# Patient Record
Sex: Male | Born: 1990 | Race: White | Hispanic: No | Marital: Married | Smoking: Never smoker
Health system: Southern US, Community
[De-identification: ages and names within clinical notes are randomized; demographics above are authoritative.]

## PROBLEM LIST (undated history)

## (undated) DIAGNOSIS — M1990 Unspecified osteoarthritis, unspecified site: Secondary | ICD-10-CM

## (undated) DIAGNOSIS — E291 Testicular hypofunction: Secondary | ICD-10-CM

## (undated) DIAGNOSIS — E781 Pure hyperglyceridemia: Secondary | ICD-10-CM

## (undated) DIAGNOSIS — E119 Type 2 diabetes mellitus without complications: Secondary | ICD-10-CM

## (undated) HISTORY — DX: Unspecified osteoarthritis, unspecified site: M19.90

## (undated) HISTORY — DX: Testicular hypofunction: E29.1

## (undated) HISTORY — DX: Pure hyperglyceridemia: E78.1

## (undated) HISTORY — PX: SPINE SURGERY: SHX786

## (undated) HISTORY — PX: APPENDECTOMY: SHX54

---

## 2014-04-29 ENCOUNTER — Ambulatory Visit (INDEPENDENT_AMBULATORY_CARE_PROVIDER_SITE_OTHER): Payer: BC Managed Care – PPO | Admitting: Emergency Medicine

## 2014-04-29 VITALS — BP 125/80 | HR 86 | Temp 98.5°F | Resp 16 | Ht 71.0 in | Wt 263.0 lb

## 2014-04-29 DIAGNOSIS — J209 Acute bronchitis, unspecified: Secondary | ICD-10-CM

## 2014-04-29 DIAGNOSIS — J018 Other acute sinusitis: Secondary | ICD-10-CM

## 2014-04-29 MED ORDER — PSEUDOEPHEDRINE-GUAIFENESIN ER 60-600 MG PO TB12: 1.0000 | ORAL_TABLET | Freq: Two times a day (BID) | ORAL | Status: DC

## 2014-04-29 MED ORDER — LEVOFLOXACIN 500 MG PO TABS: 500.0000 mg | ORAL_TABLET | Freq: Every day | ORAL | Status: AC

## 2014-04-29 MED ORDER — PROMETHAZINE-CODEINE 6.25-10 MG/5ML PO SYRP: 5.0000 mL | ORAL_SOLUTION | Freq: Four times a day (QID) | ORAL | Status: DC | PRN

## 2014-04-29 NOTE — Patient Instructions (Signed)

## 2014-04-29 NOTE — Progress Notes (Signed)
[facility] [address] [place] [address] [phone]  Date:  04/29/2014   Name:  Luke Wise   DOB:  Jun 21, 1991   MRN:  [id number]  PCP:  No PCP Per Patient    Chief Complaint: Cough and Nasal Congestion   History of Present Illness:  Luke Wise is a 23 y.o. very pleasant male patient who presents with the following:  Ill since a week ago with nasal congestion and a purulent nasal drainage and post nasal drip.  Has congestion and fullness in both ears. No fever or chills. Cough is occasionally productive and worse at night.  No wheezing or shortness of breath.   No improvement with over the counter medications or other home remedies. Denies other complaint or health concern today.    There are no active problems to display for this patient.   Past Medical History  Diagnosis Date  . Arthritis     Past Surgical History  Procedure Laterality Date  . Appendectomy    . Spine surgery      History  Substance Use Topics  . Smoking status: Never Smoker   . Smokeless tobacco: Not on file  . Alcohol Use: Not on file    Family History  Problem Relation Age of Onset  . Hypertension Mother     Allergies  Allergen Reactions  . Amoxicillin     RASH   . Penicillins     RASH    Medication list has been reviewed and updated.  No current outpatient prescriptions on file prior to visit.   No current facility-administered medications on file prior to visit.    Review of Systems:  As per HPI, otherwise negative.    Physical Examination: Filed Vitals:   04/29/14 1122  BP: 125/80  Pulse: 86  Temp: 98.5 F (36.9 C)  Resp: 16   Filed Vitals:   04/29/14 1122  Height: 5\' 11"  (1.803 m)  Weight: 263 lb (119.296 kg)   Body mass index is 36.7 kg/(m^2). Ideal Body Weight: Weight in (lb) to have BMI = 25: 178.9  GEN: WDWN, NAD, Non-toxic, A & O x 3 HEENT: Atraumatic, Normocephalic. Neck supple. No masses, No LAD. Ears and Nose: No  external deformity. CV: RRR, No M/G/R. No JVD. No thrill. No extra heart sounds. PULM: CTA B, no wheezes, crackles, rhonchi. No retractions. No resp. distress. No accessory muscle use. ABD: S, NT, ND, +BS. No rebound. No HSM. EXTR: No c/c/e NEURO Normal gait.  PSYCH: Normally interactive. Conversant. Not depressed or anxious appearing.  Calm demeanor.    Assessment and Plan: Sinusitis Bronchitis levaquin mucinex d Phen c cod   Signed,  Phillips Odor, MD

## 2015-01-13 ENCOUNTER — Ambulatory Visit (INDEPENDENT_AMBULATORY_CARE_PROVIDER_SITE_OTHER): Payer: BLUE CROSS/BLUE SHIELD | Admitting: Internal Medicine

## 2015-01-13 VITALS — BP 114/68 | HR 118 | Temp 98.2°F | Resp 17 | Ht 70.5 in | Wt 258.0 lb

## 2015-01-13 DIAGNOSIS — J029 Acute pharyngitis, unspecified: Secondary | ICD-10-CM

## 2015-01-13 DIAGNOSIS — R197 Diarrhea, unspecified: Secondary | ICD-10-CM

## 2015-01-13 DIAGNOSIS — J039 Acute tonsillitis, unspecified: Secondary | ICD-10-CM

## 2015-01-13 DIAGNOSIS — R059 Cough, unspecified: Secondary | ICD-10-CM

## 2015-01-13 DIAGNOSIS — R05 Cough: Secondary | ICD-10-CM

## 2015-01-13 LAB — POCT RAPID STREP A (OFFICE): Rapid Strep A Screen: NEGATIVE

## 2015-01-13 MED ORDER — AZITHROMYCIN 500 MG PO TABS: 500.0000 mg | ORAL_TABLET | Freq: Every day | ORAL | Status: DC

## 2015-01-13 MED ORDER — HYDROCOD POLST-CHLORPHEN POLST 10-8 MG/5ML PO LQCR: 5.0000 mL | Freq: Two times a day (BID) | ORAL | Status: DC | PRN

## 2015-01-13 NOTE — Progress Notes (Signed)
   Subjective:    Patient ID: Luke Wise, male    DOB: 1991/06/21, 24 y.o.   MRN: [id number]  HPI 2 days of sore throat, cough, and then this am 4 loose stools. Had high fever 2 days ago, now resolved, also has nasal congestion. No sob or cp.   Review of Systems     Objective:   Physical Exam  Constitutional: He is oriented to person, place, and time. He appears well-developed and well-nourished. No distress.  HENT:  Head: Normocephalic.  Right Ear: External ear normal.  Left Ear: External ear normal.  Nose: Mucosal edema, rhinorrhea and sinus tenderness present. Right sinus exhibits maxillary sinus tenderness. Right sinus exhibits no frontal sinus tenderness. Left sinus exhibits maxillary sinus tenderness. Left sinus exhibits no frontal sinus tenderness.  Mouth/Throat: Oropharyngeal exudate present.  Eyes: EOM are normal.  Neck: Normal range of motion.  Cardiovascular: Normal rate.   Pulmonary/Chest: Effort normal and breath sounds normal.  Lymphadenopathy:    He has cervical adenopathy.  Neurological: He is alert and oriented to person, place, and time. He exhibits normal muscle tone. Coordination normal.  Psychiatric: He has a normal mood and affect.     Results for orders placed or performed in visit on 01/13/15  POCT rapid strep A  Result Value Ref Range   Rapid Strep A Screen Negative Negative        Assessment & Plan:  Cough/Tonsillitis/Loose stools Tussionex/zithromax 500mg /Mucinex

## 2015-01-13 NOTE — Patient Instructions (Signed)
Food Choices to Help Relieve Diarrhea When you have diarrhea, the foods you eat and your eating habits are very important. Choosing the right foods and drinks can help relieve diarrhea. Also, because diarrhea can last up to 7 days, you need to replace lost fluids and electrolytes (such as sodium, potassium, and chloride) in order to help prevent dehydration.  WHAT GENERAL GUIDELINES DO I NEED TO FOLLOW?  Slowly drink 1 cup (8 oz) of fluid for each episode of diarrhea. If you are getting enough fluid, your urine will be clear or pale yellow.  Eat starchy foods. Some good choices include white rice, white toast, pasta, low-fiber cereal, baked potatoes (without the skin), saltine crackers, and bagels.  Avoid large servings of any cooked vegetables.  Limit fruit to two servings per day. A serving is  cup or 1 small piece.  Choose foods with less than 2 g of fiber per serving.  Limit fats to less than 8 tsp (38 g) per day.  Avoid fried foods.  Eat foods that have probiotics in them. Probiotics can be found in certain dairy products.  Avoid foods and beverages that may increase the speed at which food moves through the stomach and intestines (gastrointestinal tract). Things to avoid include:  High-fiber foods, such as dried fruit, raw fruits and vegetables, nuts, seeds, and whole grain foods.  Spicy foods and high-fat foods.  Foods and beverages sweetened with high-fructose corn syrup, honey, or sugar alcohols such as xylitol, sorbitol, and mannitol. WHAT FOODS ARE RECOMMENDED? Grains White rice. White, French, or pita breads (fresh or toasted), including plain rolls, buns, or bagels. White pasta. Saltine, soda, or graham crackers. Pretzels. Low-fiber cereal. Cooked cereals made with water (such as cornmeal, farina, or cream cereals). Plain muffins. Matzo. Melba toast. Zwieback.  Vegetables Potatoes (without the skin). Strained tomato and vegetable juices. Most well-cooked and canned  vegetables without seeds. Tender lettuce. Fruits Cooked or canned applesauce, apricots, cherries, fruit cocktail, grapefruit, peaches, pears, or plums. Fresh bananas, apples without skin, cherries, grapes, cantaloupe, grapefruit, peaches, oranges, or plums.  Meat and Other Protein Products Baked or boiled chicken. Eggs. Tofu. Fish. Seafood. Smooth peanut butter. Ground or well-cooked tender beef, ham, veal, lamb, pork, or poultry.  Dairy Plain yogurt, kefir, and unsweetened liquid yogurt. Lactose-free milk, buttermilk, or soy milk. Plain hard cheese. Beverages Sport drinks. Clear broths. Diluted fruit juices (except prune). Regular, caffeine-free sodas such as ginger ale. Water. Decaffeinated teas. Oral rehydration solutions. Sugar-free beverages not sweetened with sugar alcohols. Other Bouillon, broth, or soups made from recommended foods.  The items listed above may not be a complete list of recommended foods or beverages. Contact your dietitian for more options. WHAT FOODS ARE NOT RECOMMENDED? Grains Whole grain, whole wheat, bran, or rye breads, rolls, pastas, crackers, and cereals. Wild or brown rice. Cereals that contain more than 2 g of fiber per serving. Corn tortillas or taco shells. Cooked or dry oatmeal. Granola. Popcorn. Vegetables Raw vegetables. Cabbage, broccoli, Brussels sprouts, artichokes, baked beans, beet greens, corn, kale, legumes, peas, sweet potatoes, and yams. Potato skins. Cooked spinach and cabbage. Fruits Dried fruit, including raisins and dates. Raw fruits. Stewed or dried prunes. Fresh apples with skin, apricots, mangoes, pears, raspberries, and strawberries.  Meat and Other Protein Products Chunky peanut butter. Nuts and seeds. Beans and lentils. Bacon.  Dairy High-fat cheeses. Milk, chocolate milk, and beverages made with milk, such as milk shakes. Cream. Ice cream. Sweets and Desserts Sweet rolls, doughnuts, and sweet breads. Pancakes   and waffles. Fats and  Oils Butter. Cream sauces. Margarine. Salad oils. Plain salad dressings. Olives. Avocados.  Beverages Caffeinated beverages (such as coffee, tea, soda, or energy drinks). Alcoholic beverages. Fruit juices with pulp. Prune juice. Soft drinks sweetened with high-fructose corn syrup or sugar alcohols. Other Coconut. Hot sauce. Chili powder. Mayonnaise. Gravy. Cream-based or milk-based soups.  The items listed above may not be a complete list of foods and beverages to avoid. Contact your dietitian for more information. WHAT SHOULD I DO IF I BECOME DEHYDRATED? Diarrhea can sometimes lead to dehydration. Signs of dehydration include dark urine and dry mouth and skin. If you think you are dehydrated, you should rehydrate with an oral rehydration solution. These solutions can be purchased at pharmacies, retail stores, or online.  Drink -1 cup (120-240 mL) of oral rehydration solution each time you have an episode of diarrhea. If drinking this amount makes your diarrhea worse, try drinking smaller amounts more often. For example, drink 1-3 tsp (5-15 mL) every 5-10 minutes.  A general rule for staying hydrated is to drink 1-2 L of fluid per day. Talk to your health care provider about the specific amount you should be drinking each day. Drink enough fluids to keep your urine clear or pale yellow. Document Released: 02/05/2004 Document Revised: 11/20/2013 Document Reviewed: 10/08/2013 [facility] Patient Information 2015 [place], [place]. This information is not intended to replace advice given to you by your health care provider. Make sure you discuss any questions you have with your health care provider. Sinusitis Sinusitis is redness, soreness, and inflammation of the paranasal sinuses. Paranasal sinuses are air pockets within the bones of your face (beneath the eyes, the middle of the forehead, or above the eyes). In healthy paranasal sinuses, mucus is able to drain out, and air is able to circulate through  them by way of your nose. However, when your paranasal sinuses are inflamed, mucus and air can become trapped. This can allow bacteria and other germs to grow and cause infection. Sinusitis can develop quickly and last only a short time (acute) or continue over a long period (chronic). Sinusitis that lasts for more than 12 weeks is considered chronic.  CAUSES  Causes of sinusitis include:  Allergies.  Structural abnormalities, such as displacement of the cartilage that separates your nostrils (deviated septum), which can decrease the air flow through your nose and sinuses and affect sinus drainage.  Functional abnormalities, such as when the small hairs (cilia) that line your sinuses and help remove mucus do not work properly or are not present. SIGNS AND SYMPTOMS  Symptoms of acute and chronic sinusitis are the same. The primary symptoms are pain and pressure around the affected sinuses. Other symptoms include:  Upper toothache.  Earache.  Headache.  Bad breath.  Decreased sense of smell and taste.  A cough, which worsens when you are lying flat.  Fatigue.  Fever.  Thick drainage from your nose, which often is green and may contain pus (purulent).  Swelling and warmth over the affected sinuses. DIAGNOSIS  Your health care provider will perform a physical exam. During the exam, your health care provider may:  Look in your nose for signs of abnormal growths in your nostrils (nasal polyps).  Tap over the affected sinus to check for signs of infection.  View the inside of your sinuses (endoscopy) using an imaging device that has a light attached (endoscope). If your health care provider suspects that you have chronic sinusitis, one or more of the following  tests may be recommended:  Allergy tests.  Nasal culture. A sample of mucus is taken from your nose, sent to a lab, and screened for bacteria.  Nasal cytology. A sample of mucus is taken from your nose and examined by  your health care provider to determine if your sinusitis is related to an allergy. TREATMENT  Most cases of acute sinusitis are related to a viral infection and will resolve on their own within 10 days. Sometimes medicines are prescribed to help relieve symptoms (pain medicine, decongestants, nasal steroid sprays, or saline sprays).  However, for sinusitis related to a bacterial infection, your health care provider will prescribe antibiotic medicines. These are medicines that will help kill the bacteria causing the infection.  Rarely, sinusitis is caused by a fungal infection. In theses cases, your health care provider will prescribe antifungal medicine. For some cases of chronic sinusitis, surgery is needed. Generally, these are cases in which sinusitis recurs more than 3 times per year, despite other treatments. HOME CARE INSTRUCTIONS   Drink plenty of water. Water helps thin the mucus so your sinuses can drain more easily.  Use a humidifier.  Inhale steam 3 to 4 times a day (for example, sit in the bathroom with the shower running).  Apply a warm, moist washcloth to your face 3 to 4 times a day, or as directed by your health care provider.  Use saline nasal sprays to help moisten and clean your sinuses.  Take medicines only as directed by your health care provider.  If you were prescribed either an antibiotic or antifungal medicine, finish it all even if you start to feel better. SEEK IMMEDIATE MEDICAL CARE IF:  You have increasing pain or severe headaches.  You have nausea, vomiting, or drowsiness.  You have swelling around your face.  You have vision problems.  You have a stiff neck.  You have difficulty breathing. MAKE SURE YOU:   Understand these instructions.  Will watch your condition.  Will get help right away if you are not doing well or get worse. Document Released: 11/15/2005 Document Revised: 04/01/2014 Document Reviewed: 11/30/2011 [facility] Patient  Information 2015 [place], [place]. This information is not intended to replace advice given to you by your health care provider. Make sure you discuss any questions you have with your health care provider.

## 2015-11-26 ENCOUNTER — Ambulatory Visit (INDEPENDENT_AMBULATORY_CARE_PROVIDER_SITE_OTHER): Payer: BLUE CROSS/BLUE SHIELD | Admitting: Physician Assistant

## 2015-11-26 VITALS — BP 118/82 | HR 96 | Temp 98.1°F | Resp 18 | Ht 70.5 in | Wt 261.0 lb

## 2015-11-26 DIAGNOSIS — R05 Cough: Secondary | ICD-10-CM | POA: Diagnosis not present

## 2015-11-26 DIAGNOSIS — J039 Acute tonsillitis, unspecified: Secondary | ICD-10-CM

## 2015-11-26 DIAGNOSIS — J069 Acute upper respiratory infection, unspecified: Secondary | ICD-10-CM

## 2015-11-26 DIAGNOSIS — J029 Acute pharyngitis, unspecified: Secondary | ICD-10-CM | POA: Diagnosis not present

## 2015-11-26 DIAGNOSIS — R059 Cough, unspecified: Secondary | ICD-10-CM

## 2015-11-26 DIAGNOSIS — B9789 Other viral agents as the cause of diseases classified elsewhere: Principal | ICD-10-CM

## 2015-11-26 MED ORDER — HYDROCOD POLST-CPM POLST ER 10-8 MG/5ML PO SUER
5.0000 mL | Freq: Every evening | ORAL | Status: DC | PRN
Start: 2015-11-26 — End: 2016-04-09

## 2015-11-26 MED ORDER — BENZONATATE 100 MG PO CAPS: 100.0000 mg | ORAL_CAPSULE | Freq: Three times a day (TID) | ORAL | Status: DC | PRN

## 2015-11-26 MED ORDER — GUAIFENESIN ER 1200 MG PO TB12: 1.0000 | ORAL_TABLET | Freq: Two times a day (BID) | ORAL | Status: DC | PRN

## 2015-11-26 NOTE — Patient Instructions (Signed)
Upper Respiratory Infection, Adult Most upper respiratory infections (URIs) are a viral infection of the air passages leading to the lungs. A URI affects the nose, throat, and upper air passages. The most common type of URI is nasopharyngitis and is typically referred to as "the common cold." URIs run their course and usually go away on their own. Most of the time, a URI does not require medical attention, but sometimes a bacterial infection in the upper airways can follow a viral infection. This is called a secondary infection. Sinus and middle ear infections are common types of secondary upper respiratory infections. Bacterial pneumonia can also complicate a URI. A URI can worsen asthma and chronic obstructive pulmonary disease (COPD). Sometimes, these complications can require emergency medical care and may be life threatening.  CAUSES Almost all URIs are caused by viruses. A virus is a type of germ and can spread from one person to another.  RISKS FACTORS You may be at risk for a URI if:   You smoke.   You have chronic heart or lung disease.  You have a weakened defense (immune) system.   You are very young or very old.   You have nasal allergies or asthma.  You work in crowded or poorly ventilated areas.  You work in health care facilities or schools. SIGNS AND SYMPTOMS  Symptoms typically develop 2-3 days after you come in contact with a cold virus. Most viral URIs last 7-10 days. However, viral URIs from the influenza virus (flu virus) can last 14-18 days and are typically more severe. Symptoms may include:   Runny or stuffy (congested) nose.   Sneezing.   Cough.   Sore throat.   Headache.   Fatigue.   Fever.   Loss of appetite.   Pain in your forehead, behind your eyes, and over your cheekbones (sinus pain).  Muscle aches.  DIAGNOSIS  Your health care provider may diagnose a URI by:  Physical exam.  Tests to check that your symptoms are not due to  another condition such as:  Strep throat.  Sinusitis.  Pneumonia.  Asthma. TREATMENT  A URI goes away on its own with time. It cannot be cured with medicines, but medicines may be prescribed or recommended to relieve symptoms. Medicines may help:  Reduce your fever.  Reduce your cough.  Relieve nasal congestion. HOME CARE INSTRUCTIONS   Take medicines only as directed by your health care provider.   Gargle warm saltwater or take cough drops to comfort your throat as directed by your health care provider.  Use a warm mist humidifier or inhale steam from a shower to increase air moisture. This may make it easier to breathe.  Drink enough fluid to keep your urine clear or pale yellow.   Eat soups and other clear broths and maintain good nutrition.   Rest as needed.   Return to work when your temperature has returned to normal or as your health care provider advises. You may need to stay home longer to avoid infecting others. You can also use a face mask and careful hand washing to prevent spread of the virus.  Increase the usage of your inhaler if you have asthma.   Do not use any tobacco products, including cigarettes, chewing tobacco, or electronic cigarettes. If you need help quitting, ask your health care provider. PREVENTION  The best way to protect yourself from getting a cold is to practice good hygiene.   Avoid oral or hand contact with people with cold   symptoms.   Wash your hands often if contact occurs.  There is no clear evidence that vitamin C, vitamin E, echinacea, or exercise reduces the chance of developing a cold. However, it is always recommended to get plenty of rest, exercise, and practice good nutrition.  SEEK MEDICAL CARE IF:   You are getting worse rather than better.   Your symptoms are not controlled by medicine.   You have chills.  You have worsening shortness of breath.  You have brown or red mucus.  You have yellow or brown nasal  discharge.  You have pain in your face, especially when you bend forward.  You have a fever.  You have swollen neck glands.  You have pain while swallowing.  You have white areas in the back of your throat. SEEK IMMEDIATE MEDICAL CARE IF:   You have severe or persistent:  Headache.  Ear pain.  Sinus pain.  Chest pain.  You have chronic lung disease and any of the following:  Wheezing.  Prolonged cough.  Coughing up blood.  A change in your usual mucus.  You have a stiff neck.  You have changes in your:  Vision.  Hearing.  Thinking.  Mood. MAKE SURE YOU:   Understand these instructions.  Will watch your condition.  Will get help right away if you are not doing well or get worse.   This information is not intended to replace advice given to you by your health care provider. Make sure you discuss any questions you have with your health care provider.   Document Released: 05/11/2001 Document Revised: 04/01/2015 Document Reviewed: 02/20/2014 Elsevier Interactive Patient Education 2016 [employer]vier Inc.  

## 2015-11-26 NOTE — Progress Notes (Signed)
[facility] [address], [place] [id number] [phone]  Date:  11/26/2015   Name:  Luke Wise   DOB:  February 21, 1991   MRN:  [id number]  PCP:  No PCP Per Patient    History of Present Illness:  Luke Wise is a 24 y.o. male patient [facility] complaint of sore throat and productive cough.  Patient states this started 3 days with productive clear sputum.  He has nasal congestion with clear mucus.  He denies fever at this time, though he had some subjective fever and chills.  No sob or dyspnea. No GI symptoms.   He has taken flu congestion, sore throat, and nasal congestion medication which appears to not be working much.   There are no active problems to display for this patient.   Past Medical History  Diagnosis Date  . Arthritis     Past Surgical History  Procedure Laterality Date  . Appendectomy    . Spine surgery      Social History  Substance Use Topics  . Smoking status: Never Smoker   . Smokeless tobacco: None  . Alcohol Use: None    Family History  Problem Relation Age of Onset  . Hypertension Mother     Allergies  Allergen Reactions  . Amoxicillin     RASH   . Penicillins     RASH    Medication list has been reviewed and updated.  Current Outpatient Prescriptions on File Prior to Visit  Medication Sig Dispense Refill  . azithromycin (ZITHROMAX) 500 MG tablet Take 1 tablet (500 mg total) by mouth daily. (Patient not taking: Reported on 11/26/2015) 5 tablet 0  . chlorpheniramine-HYDROcodone (TUSSIONEX PENNKINETIC ER) 10-8 MG/5ML LQCR Take 5 mLs by mouth every 12 (twelve) hours as needed for cough (cough). (Patient not taking: Reported on 11/26/2015) 115 mL 0  . fenofibrate (TRICOR) 145 MG tablet Take 145 mg by mouth daily. Reported on 11/26/2015     No current facility-administered medications on file prior to visit.    ROS   Physical Examination: BP 118/82 mmHg  Pulse 96  Temp(Src) 98.1 F  (36.7 C) (Oral)  Resp 18  Ht 5' 10.5" (1.791 m)  Wt 261 lb (118.389 kg)  BMI 36.91 kg/m2  SpO2 98% Ideal Body Weight: Weight in (lb) to have BMI = 25: 176.4  Physical Exam  Constitutional: He is oriented to person, place, and time. He appears well-developed and well-nourished. No distress.  HENT:  Head: Atraumatic.  Right Ear: Tympanic membrane, external ear and ear canal normal.  Left Ear: Tympanic membrane, external ear and ear canal normal.  Nose: Mucosal edema and rhinorrhea present. Right sinus exhibits no maxillary sinus tenderness and no frontal sinus tenderness. Left sinus exhibits no maxillary sinus tenderness and no frontal sinus tenderness.  Mouth/Throat: No uvula swelling. No oropharyngeal exudate, posterior oropharyngeal edema or posterior oropharyngeal erythema.  Eyes: Conjunctivae, EOM and lids are normal. Pupils are equal, round, and reactive to light. Right eye exhibits normal extraocular motion. Left eye exhibits normal extraocular motion.  Neck: Trachea normal and full passive range of motion without pain. No edema and no erythema present.  Cardiovascular: Normal rate.   Pulmonary/Chest: Effort normal. No respiratory distress. He has no decreased breath sounds. He has no wheezes. He has no rhonchi.  Neurological: He is alert and oriented to person, place, and time.  Skin: Skin is warm and dry. He is not diaphoretic.  Psychiatric: He  has a normal mood and affect. His behavior is normal.     Assessment and Plan: Luke Wise is a 24 y.o. male who is here today for sore throat, congestion, and cough. This appears to be viral.  Treating supportively. Advised to contact me if he continues to have difficulty after 3 days.    Viral URI with cough  Sore throat - Plan: chlorpheniramine-HYDROcodone (TUSSIONEX PENNKINETIC ER) 10-8 MG/5ML SUER  Cough - Plan: chlorpheniramine-HYDROcodone (TUSSIONEX PENNKINETIC ER) 10-8 MG/5ML SUER, Guaifenesin (MUCINEX MAXIMUM STRENGTH)  1200 MG TB12, benzonatate (TESSALON) 100 MG capsule  Tonsillitis  Trena Platt, PA-C [facility] 11/26/2015 11:29 AM

## 2016-04-09 ENCOUNTER — Ambulatory Visit (INDEPENDENT_AMBULATORY_CARE_PROVIDER_SITE_OTHER): Payer: BLUE CROSS/BLUE SHIELD | Admitting: Physician Assistant

## 2016-04-09 VITALS — BP 126/80 | HR 70 | Temp 97.8°F | Resp 16 | Ht 70.5 in | Wt 256.6 lb

## 2016-04-09 DIAGNOSIS — M545 Low back pain, unspecified: Secondary | ICD-10-CM

## 2016-04-09 MED ORDER — MELOXICAM 15 MG PO TABS: 15.0000 mg | ORAL_TABLET | Freq: Every day | ORAL | Status: DC

## 2016-04-09 MED ORDER — CYCLOBENZAPRINE HCL 10 MG PO TABS: 5.0000 mg | ORAL_TABLET | Freq: Three times a day (TID) | ORAL | Status: DC | PRN

## 2016-04-09 NOTE — Progress Notes (Signed)
   04/09/2016 11:15 AM   DOB: 1991-11-15 / MRN: [id number]  SUBJECTIVE:  Luke Wise is a 25 y.o. male presenting for low back pain that started ten days ago and is worsening.  He denies weakness, numbness, incontinence. Denies any trauma to the area.  Has tried 7.5 mg Meloxicam without relief.    He is allergic to amoxicillin and penicillins.   He  has a past medical history of Arthritis.    He  reports that he has never smoked. He does not have any smokeless tobacco history on file. He  has no sexual activity history on file. The patient  has past surgical history that includes Appendectomy and Spine surgery.  His family history includes Hypertension in his mother.  Review of Systems  Constitutional: Negative for fever and chills.  Respiratory: Negative for shortness of breath.   Gastrointestinal: Negative for nausea, vomiting and abdominal pain.  Genitourinary: Negative for dysuria, urgency and frequency.  Musculoskeletal: Positive for myalgias and back pain.  Skin: Negative for rash.  Neurological: Negative for dizziness, tingling, focal weakness and headaches.  Psychiatric/Behavioral: The patient does not have insomnia.     Problem list and medications reviewed and updated by myself where necessary, and exist elsewhere in the encounter.   OBJECTIVE:  BP 126/80 mmHg  Pulse 70  Temp(Src) 97.8 F (36.6 C) (Oral)  Resp 16  Ht 5' 10.5" (1.791 m)  Wt 256 lb 9.6 oz (116.393 kg)  BMI 36.29 kg/m2  SpO2 97%  Physical Exam  Constitutional: He is oriented to person, place, and time. He appears well-developed. He does not appear ill.  Eyes: Conjunctivae and EOM are normal. Pupils are equal, round, and reactive to light.  Cardiovascular: Normal rate.   Pulmonary/Chest: Effort normal.  Abdominal: He exhibits no distension.  Musculoskeletal: Normal range of motion.  Neurological: He is alert and oriented to person, place, and time. He has normal strength and normal reflexes. No  cranial nerve deficit or sensory deficit. He displays a negative Romberg sign. Gait abnormal. Coordination normal.  Skin: Skin is warm and dry. He is not diaphoretic.  Psychiatric: He has a normal mood and affect.  Nursing note and vitals reviewed.   No results found for this or any previous visit (from the past 72 hour(s)).  No results found.  ASSESSMENT AND PLAN  Luke was seen today for back pain.  Diagnoses and all orders for this visit:  Midline low back pain without sciatica  Other orders -     meloxicam (MOBIC) 15 MG tablet; Take 1 tablet (15 mg total) by mouth daily. -     cyclobenzaprine (FLEXERIL) 10 MG tablet; Take 0.5-1 tablets (5-10 mg total) by mouth 3 (three) times daily as needed. May cause drowsiness.   The patient was advised to call or return to clinic if he does not see an improvement in symptoms or to seek the care of the closest emergency department if he worsens with the above plan.   Deliah BostonMichael Roger Kettles, MHS, PA-C [facility] 04/09/2016 11:15 AM

## 2016-04-09 NOTE — Patient Instructions (Addendum)
Please let me know if you are not better in 7 days and I will try prednisone.  Call [phone] and ask to leave a message for Deliah BostonMichael Clark PA-C    IF you received an x-ray today, you will receive an invoice from [facility]. Please contact [facility] at [phone] with questions or concerns regarding your invoice.   IF you received labwork today, you will receive an invoice from [facility]. Please contact [facility] at [phone] with questions or concerns regarding your invoice.   Our billing staff will not be able to assist you with questions regarding bills from these companies.  You will be contacted with the lab results as soon as they are available. The fastest way to get your results is to activate your My Chart account. Instructions are located on the last page of this paperwork. If you have not heard from us regarding the results in 2 weeks, please contact this office.

## 2016-11-05 ENCOUNTER — Ambulatory Visit: Payer: Self-pay | Admitting: Endocrinology

## 2016-11-11 ENCOUNTER — Ambulatory Visit (INDEPENDENT_AMBULATORY_CARE_PROVIDER_SITE_OTHER): Payer: BLUE CROSS/BLUE SHIELD | Admitting: Endocrinology

## 2016-11-11 ENCOUNTER — Encounter: Payer: Self-pay | Admitting: Endocrinology

## 2016-11-11 DIAGNOSIS — E781 Pure hyperglyceridemia: Secondary | ICD-10-CM | POA: Diagnosis not present

## 2016-11-11 DIAGNOSIS — E291 Testicular hypofunction: Secondary | ICD-10-CM | POA: Diagnosis not present

## 2016-11-11 LAB — TSH: TSH: 1.35 u[IU]/mL (ref 0.35–4.50)

## 2016-11-11 LAB — LUTEINIZING HORMONE: LH: 6.01 m[IU]/mL (ref 1.50–9.30)

## 2016-11-11 NOTE — Progress Notes (Signed)
Subjective:    Patient ID: Luke Wise, male    DOB: Nov 20, 1991, 25 y.o.   MRN: [id number]  HPI Pt is referred by Dr Via, for hypogonadism.  Pt reports he had puberty at age 25.  He has no biological children.  He says he has never taken illicit androgens.  He has never been on any prescribed medication for hypogonadism.  He does not take antiandrogens or opioids.  He denies any h/o infertility, XRT, or genital infection.  He has never had surgery, or a serious injury to the head or genital area. He has no h/o sleep apnea or DVT.   He does not consume alcohol excessively.  He has moderate assoc ED sxs.   Past Medical History:  Diagnosis Date  . Arthritis   . Hypertriglyceridemia   . Hypogonadism male     Past Surgical History:  Procedure Laterality Date  . APPENDECTOMY    . SPINE SURGERY      Social History   Social History  . Marital status: Single    Spouse name: N/A  . Number of children: N/A  . Years of education: N/A   Occupational History  . Not on file.   Social History Main Topics  . Smoking status: Never Smoker  . Smokeless tobacco: Not on file  . Alcohol use Not on file  . Drug use: Unknown  . Sexual activity: Not on file   Other Topics Concern  . Not on file   Social History Narrative  . No narrative on file    No current outpatient prescriptions on file prior to visit.   No current facility-administered medications on file prior to visit.     Allergies  Allergen Reactions  . Amoxicillin     RASH   . Penicillins     RASH    Family History  Problem Relation Age of Onset  . Hypertension Mother   . Other Neg Hx     hypogonadism    BP 116/80   Pulse (!) 104   Ht 5' 11.5" (1.816 m)   Wt 267 lb (121.1 kg)   SpO2 96%   BMI 36.72 kg/m   Review of Systems denies depression, numbness, weight change, decreased urinary stream, gynecomastia, muscle weakness, fever, headache, easy bruising, sob, rash, blurry vision, rhinorrhea, chest pain.        Objective:   Physical Exam VS: see vs page GEN: no distress HEAD: head: no deformity eyes: no periorbital swelling, no proptosis.  external nose and ears are normal.  mouth: no lesion seen.  NECK: supple, thyroid is not enlarged.  CHEST WALL: no deformity.  LUNGS: clear to auscultation.  BREASTS:  Mild bilat pseudogynecomastia.   CV: reg rate and rhythm, no murmur.  ABD: abdomen is soft, nontender.  no hepatosplenomegaly.  not distended.  no hernia GENITALIA:  Normal male.   MUSCULOSKELETAL: muscle bulk and strength are grossly normal.  no obvious joint swelling.  gait is normal and steady EXTEMITIES: no deformity.  no ulcer on the feet.  feet are of normal color and temp.  no edema PULSES: dorsalis pedis intact bilat.  no carotid bruit NEURO:  cn 2-12 grossly intact.   readily moves all 4's.  sensation is intact to touch on the feet SKIN:  Normal texture and temperature.  No rash or suspicious lesion is visible.  Normal hair distribution.  NODES:  None palpable at the neck PSYCH: alert, well-oriented.  Does not appear anxious nor depressed.  outside  test results are reviewed: Testosterone=  160 and 190 AST=78  I have reviewed outside records, and summarized: Pt was noted to have low testosterone, and referred here.  He reported ED sxs approx 1/2 of the time. He is also being rx'ed for hypertriglyceridemia Lab Results  Component Value Date   TESTOSTERONE 141 (L) 11/11/2016   Lab Results  Component Value Date   TSH 1.35 11/11/2016      Assessment & Plan:  central hypogonadism, new, uncertain etiology.  Check MRI.  NASH: this could worse with testosterone rx. ED: prob due to hypogonadism.  Obesity: this worsens hypogonadism.    Patient is advised the following: Patient Instructions  blood tests are requested for you today.  We'll let you know about the results.  Based on the results, I hope to be able to prescribe for you a pill to increase the  testosterone. Testosterone treatment has risks, including increased or decreased fertility (depending on the type of treatment), hair loss, prostate cancer, benign prostate enlargement, blood clots, liver problems, lower hdl ("good cholesterol"), polycythemia (opposite of anemia), sleep apnea, and behavior changes. Weight loss helps the testosterone, also.

## 2016-11-11 NOTE — Patient Instructions (Addendum)
blood tests are requested for you today.  We'll let you know about the results.  Based on the results, I hope to be able to prescribe for you a pill to increase the testosterone. Testosterone treatment has risks, including increased or decreased fertility (depending on the type of treatment), hair loss, prostate cancer, benign prostate enlargement, blood clots, liver problems, lower hdl ("good cholesterol"), polycythemia (opposite of anemia), sleep apnea, and behavior changes. Weight loss helps the testosterone, also.

## 2016-11-12 LAB — PROLACTIN: Prolactin: 6.8 ng/mL (ref 2.0–18.0)

## 2016-11-13 LAB — TESTOSTERONE,FREE AND TOTAL
TESTOSTERONE FREE: 8.3 pg/mL — AB (ref 9.3–26.5)
Testosterone: 141 ng/dL — ABNORMAL LOW (ref 264–916)

## 2016-12-11 ENCOUNTER — Ambulatory Visit
Admission: RE | Admit: 2016-12-11 | Discharge: 2016-12-11 | Disposition: A | Discharge status: Home / Self Care | Payer: BLUE CROSS/BLUE SHIELD | Source: Ambulatory Visit | Attending: Endocrinology | Admitting: Endocrinology

## 2016-12-11 DIAGNOSIS — E291 Testicular hypofunction: Secondary | ICD-10-CM

## 2016-12-11 MED ORDER — GADOBENATE DIMEGLUMINE 529 MG/ML IV SOLN
20.0000 mL | Freq: Once | INTRAVENOUS | Status: AC | PRN
  Administered 2016-12-11: 20 mL via INTRAVENOUS

## 2016-12-13 ENCOUNTER — Other Ambulatory Visit: Payer: Self-pay | Admitting: Endocrinology

## 2016-12-13 DIAGNOSIS — E291 Testicular hypofunction: Secondary | ICD-10-CM

## 2016-12-13 MED ORDER — CLOMIPHENE CITRATE 50 MG PO TABS
ORAL_TABLET | ORAL | 2 refills | Status: DC
Start: 2016-12-13 — End: 2017-04-19

## 2017-01-24 ENCOUNTER — Other Ambulatory Visit (INDEPENDENT_AMBULATORY_CARE_PROVIDER_SITE_OTHER): Payer: BLUE CROSS/BLUE SHIELD

## 2017-01-24 DIAGNOSIS — E291 Testicular hypofunction: Secondary | ICD-10-CM

## 2017-01-24 LAB — CBC WITH DIFFERENTIAL/PLATELET
BASOS ABS: 0.1 10*3/uL (ref 0.0–0.1)
Basophils Relative: 0.7 % (ref 0.0–3.0)
Eosinophils Absolute: 0.3 10*3/uL (ref 0.0–0.7)
Eosinophils Relative: 2.9 % (ref 0.0–5.0)
HCT: 49.3 % (ref 39.0–52.0)
HEMOGLOBIN: 16 g/dL (ref 13.0–17.0)
LYMPHS ABS: 3.1 10*3/uL (ref 0.7–4.0)
Lymphocytes Relative: 31.9 % (ref 12.0–46.0)
MCHC: 32.4 g/dL (ref 30.0–36.0)
MCV: 75.2 fl — AB (ref 78.0–100.0)
MONOS PCT: 8.6 % (ref 3.0–12.0)
Monocytes Absolute: 0.9 10*3/uL (ref 0.1–1.0)
Neutro Abs: 5.5 10*3/uL (ref 1.4–7.7)
Neutrophils Relative %: 55.9 % (ref 43.0–77.0)
Platelets: 291 10*3/uL (ref 150.0–400.0)
RBC: 6.56 Mil/uL — AB (ref 4.22–5.81)
RDW: 15.1 % (ref 11.5–15.5)
WBC: 9.9 10*3/uL (ref 4.0–10.5)

## 2017-01-25 LAB — TESTOSTERONE,FREE AND TOTAL
TESTOSTERONE FREE: 14.9 pg/mL (ref 9.3–26.5)
TESTOSTERONE: 233 ng/dL — AB (ref 264–916)

## 2017-02-24 ENCOUNTER — Ambulatory Visit (INDEPENDENT_AMBULATORY_CARE_PROVIDER_SITE_OTHER): Payer: BLUE CROSS/BLUE SHIELD

## 2017-02-24 ENCOUNTER — Encounter: Payer: Self-pay | Admitting: Emergency Medicine

## 2017-02-24 ENCOUNTER — Ambulatory Visit (INDEPENDENT_AMBULATORY_CARE_PROVIDER_SITE_OTHER): Payer: BLUE CROSS/BLUE SHIELD | Admitting: Emergency Medicine

## 2017-02-24 VITALS — BP 131/80 | HR 74 | Temp 98.4°F | Resp 18 | Ht 71.5 in | Wt 272.2 lb

## 2017-02-24 DIAGNOSIS — R0789 Other chest pain: Secondary | ICD-10-CM

## 2017-02-24 DIAGNOSIS — R0781 Pleurodynia: Secondary | ICD-10-CM

## 2017-02-24 MED ORDER — PREDNISONE 20 MG PO TABS: 40.0000 mg | ORAL_TABLET | Freq: Every day | ORAL | 0 refills | Status: AC

## 2017-02-24 MED ORDER — DICLOFENAC SODIUM 75 MG PO TBEC: 75.0000 mg | DELAYED_RELEASE_TABLET | Freq: Two times a day (BID) | ORAL | 0 refills | Status: AC

## 2017-02-24 NOTE — Patient Instructions (Addendum)
   IF you received an x-ray today, you will receive an invoice from [facility]. Please contact [facility] at [phone] with questions or concerns regarding your invoice.   IF you received labwork today, you will receive an invoice from [facility]. Please contact [facility] at [phone] with questions or concerns regarding your invoice.   Our billing staff will not be able to assist you with questions regarding bills from these companies.  You will be contacted with the lab results as soon as they are available. The fastest way to get your results is to activate your My Chart account. Instructions are located on the last page of this paperwork. If you have not heard from us regarding the results in 2 weeks, please contact this office.     Nonspecific Chest Pain Chest pain can be caused by many different conditions. There is a chance that your pain could be related to something serious, such as a heart attack or a blood clot in your lungs. Chest pain can also be caused by conditions that are not life-threatening. If you have chest pain, it is very important to follow up with your doctor. Follow these instructions at home: Medicines  If you were prescribed an antibiotic medicine, take it as told by your doctor. Do not stop taking the antibiotic even if you start to feel better.  Take over-the-counter and prescription medicines only as told by your doctor. Lifestyle  Do not use any products that contain nicotine or tobacco, such as cigarettes and e-cigarettes. If you need help quitting, ask your doctor.  Do not drink alcohol.  Make lifestyle changes as told by your doctor. These may include: ? Getting regular exercise. Ask your doctor for some activities that are safe for you. ? Eating a heart-healthy diet. A diet specialist (dietitian) can help you to learn healthy eating options. ? Staying at a healthy weight. ? Managing diabetes, if needed. ? Lowering your  stress, as with deep breathing or spending time in nature. General instructions  Avoid any activities that make you feel chest pain.  If your chest pain is because of heartburn: ? Raise (elevate) the head of your bed about 6 inches (15 cm). You can do this by putting blocks under the bed legs at the head of the bed. ? Do not sleep with extra pillows under your head. That does not help heartburn.  Keep all follow-up visits as told by your doctor. This is important. This includes any further testing if your chest pain does not go away. Contact a doctor if:  Your chest pain does not go away.  You have a rash with blisters on your chest.  You have a fever.  You have chills. Get help right away if:  Your chest pain is worse.  You have a cough that gets worse, or you cough up blood.  You have very bad (severe) pain in your belly (abdomen).  You are very weak.  You pass out (faint).  You have either of these for no clear reason: ? Sudden chest discomfort. ? Sudden discomfort in your arms, back, neck, or jaw.  You have shortness of breath at any time.  You suddenly start to sweat, or your skin gets clammy.  You feel sick to your stomach (nauseous).  You throw up (vomit).  You suddenly feel light-headed or dizzy.  Your heart starts to beat fast, or it feels like it is skipping beats. These symptoms may be an emergency. Do not wait   to see if the symptoms will go away. Get medical help right away. Call your local emergency services (911 in the [place] Do not drive yourself to the hospital. This information is not intended to replace advice given to you by your health care provider. Make sure you discuss any questions you have with your health care provider. Document Released: 05/03/2008 Document Revised: 08/09/2016 Document Reviewed: 08/09/2016 Elsevier Interactive Patient Education  2017 Elsevier Inc.  

## 2017-02-24 NOTE — Progress Notes (Signed)
Luke Wise 26 y.o.   Chief Complaint  Patient presents with  . Breathing Problem    pain in left side when breathing on and off for about 4 days     HISTORY OF PRESENT ILLNESS: This is a 26 y.o. male complaining of left sided localized pleuritic chest pain x 4-5 days.  Chest Pain   This is a new problem. The current episode started in the past 7 days. The onset quality is gradual. The problem occurs intermittently. The problem has been waxing and waning. The pain is present in the lateral region. The pain is at a severity of 3/10. The pain is mild. The quality of the pain is described as sharp and stabbing. The pain does not radiate. Associated symptoms include shortness of breath. Pertinent negatives include no abdominal pain, claudication, cough, diaphoresis, dizziness, fever, headaches, hemoptysis, irregular heartbeat, lower extremity edema, malaise/fatigue, nausea, palpitations, syncope or vomiting. The pain is aggravated by deep breathing and coughing. There are no known risk factors.  Pertinent negatives for past medical history include no aortic aneurysm, no arrhythmia, no CAD, no cancer, no COPD, no diabetes, no hypertension, no MI, no pacemaker, no PE, no spontaneous pneumothorax and no thyroid problem.     Prior to Admission medications   Medication Sig Start Date End Date Taking? Authorizing Provider  clomiPHENE (CLOMID) 50 MG tablet 1/4 tab daily 12/13/16  Yes Romero Belling, MD  fenofibrate (TRICOR) 48 MG tablet  10/18/16  Yes Historical Provider, MD    Allergies  Allergen Reactions  . Amoxicillin     RASH   . Penicillins     RASH    Patient Active Problem List   Diagnosis Date Noted  . Hypogonadism male   . Hypertriglyceridemia     Past Medical History:  Diagnosis Date  . Arthritis   . Hypertriglyceridemia   . Hypogonadism male     Past Surgical History:  Procedure Laterality Date  . APPENDECTOMY    . SPINE SURGERY      Social History   Social  History  . Marital status: Single    Spouse name: N/A  . Number of children: N/A  . Years of education: N/A   Occupational History  . Not on file.   Social History Main Topics  . Smoking status: Never Smoker  . Smokeless tobacco: Not on file  . Alcohol use Not on file  . Drug use: Unknown  . Sexual activity: Not on file   Other Topics Concern  . Not on file   Social History Narrative  . No narrative on file    Family History  Problem Relation Age of Onset  . Hypertension Mother   . Other Neg Hx     hypogonadism     Review of Systems  Constitutional: Negative for diaphoresis, fever and malaise/fatigue.  HENT: Negative.   Eyes: Negative.   Respiratory: Positive for shortness of breath. Negative for cough and hemoptysis.   Cardiovascular: Positive for chest pain. Negative for palpitations, claudication, leg swelling and syncope.  Gastrointestinal: Negative for abdominal pain, diarrhea, nausea and vomiting.  Genitourinary: Negative for dysuria and hematuria.  Musculoskeletal: Negative.   Skin: Negative for rash.  Neurological: Negative for dizziness and headaches.  All other systems reviewed and are negative.  Vitals:   02/24/17 0816  BP: 131/80  Pulse: 74  Resp: 18  Temp: 98.4 F (36.9 C)    Physical Exam  Constitutional: He is oriented to person, place, and time. He appears  well-developed and well-nourished.  HENT:  Head: Normocephalic and atraumatic.  Nose: Nose normal.  Mouth/Throat: Oropharynx is clear and moist. No oropharyngeal exudate.  Eyes: Conjunctivae and EOM are normal. Pupils are equal, round, and reactive to light.  Neck: Normal range of motion. Neck supple. No JVD present. No thyromegaly present.  Cardiovascular: Normal rate, regular rhythm, normal heart sounds and intact distal pulses.   No murmur heard. Pulmonary/Chest: Effort normal and breath sounds normal. He has no wheezes. He has no rales.  Abdominal: Soft. Bowel sounds are normal. He  exhibits no distension. There is no tenderness.  Musculoskeletal: Normal range of motion.  Lymphadenopathy:    He has no cervical adenopathy.  Neurological: He is alert and oriented to person, place, and time. No sensory deficit. He exhibits normal muscle tone.  Skin: Skin is warm and dry. Capillary refill takes less than 2 seconds.  Psychiatric: He has a normal mood and affect. His behavior is normal.  Vitals reviewed.  EKG: NSR, no acute ischemic changes; no pericarditis changes. CXR reviewed by me: NAD.  ASSESSMENT & PLAN: [name] was seen today for breathing problem.  Diagnoses and all orders for this visit:  Atypical chest pain -     EKG 12-Lead -     DG Chest 2 View; Future  Pleuritic pain -     EKG 12-Lead -     DG Chest 2 View; Future    Patient Instructions       IF you received an x-ray today, you will receive an invoice from [facility]. Please contact [facility] at [phone] with questions or concerns regarding your invoice.   IF you received labwork today, you will receive an invoice from [facility]. Please contact [facility] at [phone] with questions or concerns regarding your invoice.   Our billing staff will not be able to assist you with questions regarding bills from these companies.  You will be contacted with the lab results as soon as they are available. The fastest way to get your results is to activate your My Chart account. Instructions are located on the last page of this paperwork. If you have not heard from us regarding the results in 2 weeks, please contact this office.      Nonspecific Chest Pain Chest pain can be caused by many different conditions. There is a chance that your pain could be related to something serious, such as a heart attack or a blood clot in your lungs. Chest pain can also be caused by conditions that are not life-threatening. If you have chest pain, it is very important to follow up with your  doctor. Follow these instructions at home: Medicines   If you were prescribed an antibiotic medicine, take it as told by your doctor. Do not stop taking the antibiotic even if you start to feel better.  Take over-the-counter and prescription medicines only as told by your doctor. Lifestyle   Do not use any products that contain nicotine or tobacco, such as cigarettes and e-cigarettes. If you need help quitting, ask your doctor.  Do not drink alcohol.  Make lifestyle changes as told by your doctor. These may include:  Getting regular exercise. Ask your doctor for some activities that are safe for you.  Eating a heart-healthy diet. A diet specialist (dietitian) can help you to learn healthy eating options.  Staying at a healthy weight.  Managing diabetes, if needed.  Lowering your stress, as with deep breathing or spending time in nature. General instructions  Avoid any activities that make you feel chest pain.  If your chest pain is because of heartburn:  Raise (elevate) the head of your bed about 6 inches (15 cm). You can do this by putting blocks under the bed legs at the head of the bed.  Do not sleep with extra pillows under your head. That does not help heartburn.  Keep all follow-up visits as told by your doctor. This is important. This includes any further testing if your chest pain does not go away. Contact a doctor if:  Your chest pain does not go away.  You have a rash with blisters on your chest.  You have a fever.  You have chills. Get help right away if:  Your chest pain is worse.  You have a cough that gets worse, or you cough up blood.  You have very bad (severe) pain in your belly (abdomen).  You are very weak.  You pass out (faint).  You have either of these for no clear reason:  Sudden chest discomfort.  Sudden discomfort in your arms, back, neck, or jaw.  You have shortness of breath at any time.  You suddenly start to sweat, or your  skin gets clammy.  You feel sick to your stomach (nauseous).  You throw up (vomit).  You suddenly feel light-headed or dizzy.  Your heart starts to beat fast, or it feels like it is skipping beats. These symptoms may be an emergency. Do not wait to see if the symptoms will go away. Get medical help right away. Call your local emergency services (911 in the [place] Do not drive yourself to the hospital. This information is not intended to replace advice given to you by your health care provider. Make sure you discuss any questions you have with your health care provider. Document Released: 05/03/2008 Document Revised: 08/09/2016 Document Reviewed: 08/09/2016 Elsevier Interactive Patient Education  2017 Elsevier Inc.       Edwina Barth, MD [facility]

## 2017-04-19 ENCOUNTER — Other Ambulatory Visit: Payer: Self-pay | Admitting: Endocrinology

## 2017-08-14 ENCOUNTER — Other Ambulatory Visit: Payer: Self-pay | Admitting: Endocrinology

## 2018-08-24 IMAGING — MR MR HEAD WO/W CM
16 of 19 series · 35 of 48 positions shown · IV contrast (multihance)
Comparison: None.

CLINICAL DATA: Hypogonadism.

EXAM:
MRI HEAD WITHOUT AND WITH CONTRAST
TECHNIQUE: Multiplanar, multiecho pulse sequences of the brain and surrounding
structures were obtained without and with intravenous contrast. A
dedicated pituitary protocol was utilized, including dynamic
post-contrast imaging of the pituitary gland and sellar structures.
CONTRAST:  20mL MULTIHANCE GADOBENATE DIMEGLUMINE 529 MG/ML IV SOLN

[Series 3: t1_se_sag · sagittal · 5.0mm · 0.45mm/px · 1 of 22 slices shown]
[im 1/22]
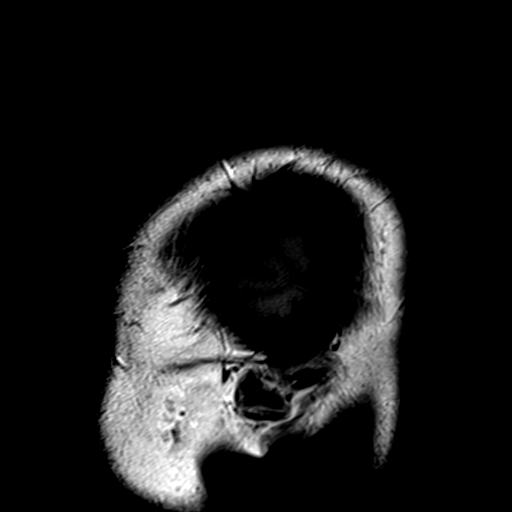

[Series 4: ep2d_diff_(id)_trace · axial · 3.0mm · 1.80mm/px · z∈[-20,+126]mm · 8 of 99 slices shown]
[im 1/99]
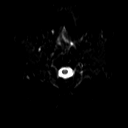
[im 11/99]
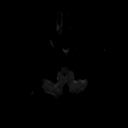
[im 33/99]
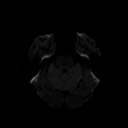
[im 44/99]
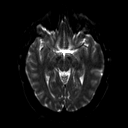
[im 55/99]
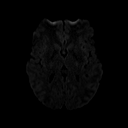
[im 66/99]
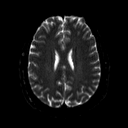
[im 88/99]
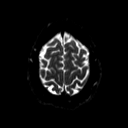
[im 99/99]
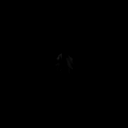

[Series 5: ep2d_diff_(id)_trace_adc · axial · 3.0mm · 1.80mm/px · z∈[-20,+126]mm · 5 of 50 slices shown]
[im 1/50]
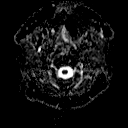
[im 13/50]
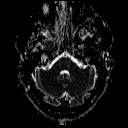
[im 25/50]
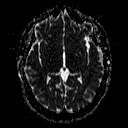
[im 37/50]
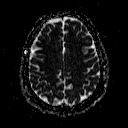
[im 50/50]
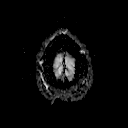

[Series 6: T2 · axial · 5.0mm · 0.45mm/px · z∈[-26,+124]mm · 3 of 24 slices shown]
[im 1/24]
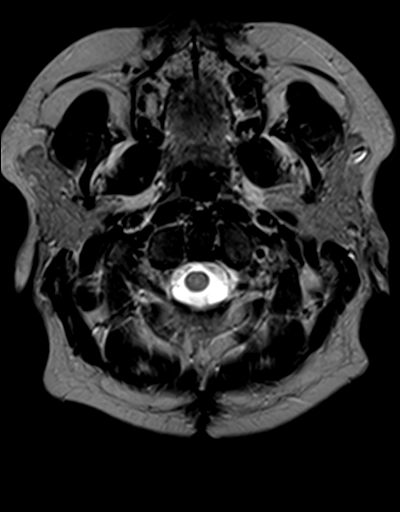
[im 12/24]
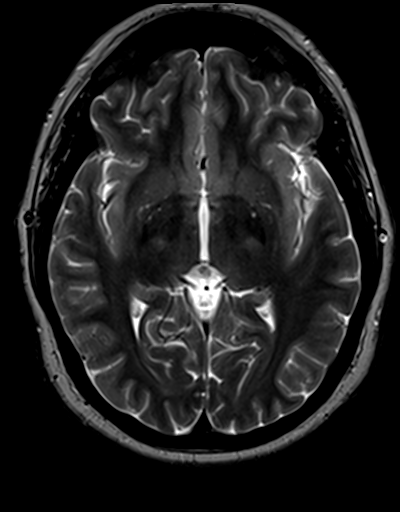
[im 24/24]
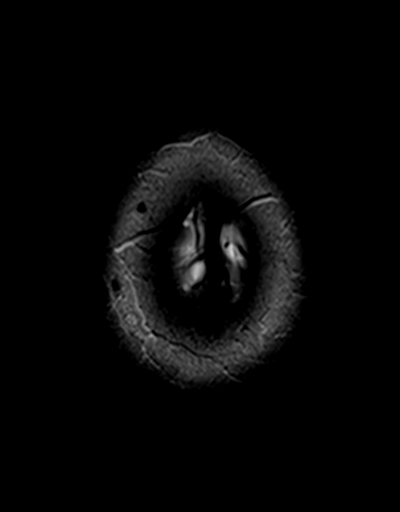

[Series 7: FLAIR · axial · 5.0mm · 0.45mm/px · z∈[-25,+124]mm · 3 of 24 slices shown]
[im 1/24]
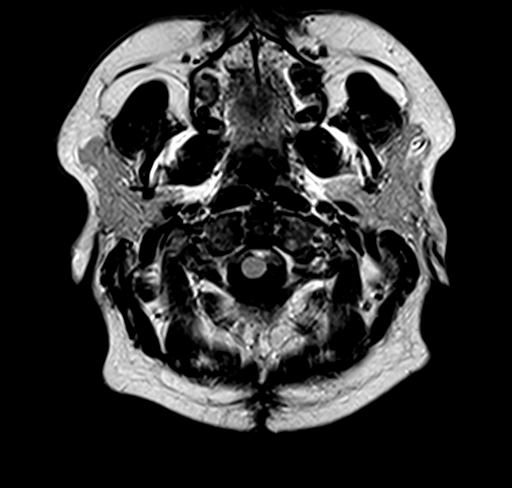
[im 12/24]
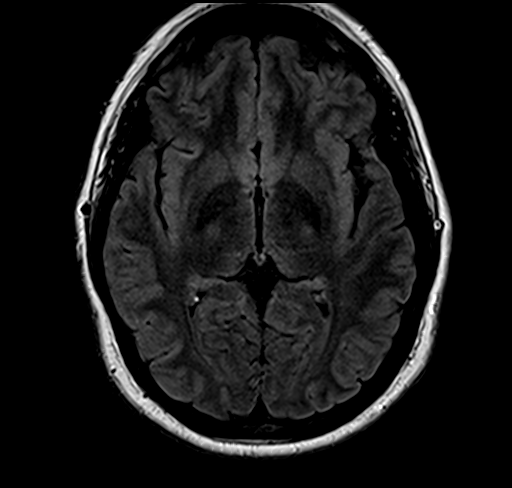
[im 24/24]
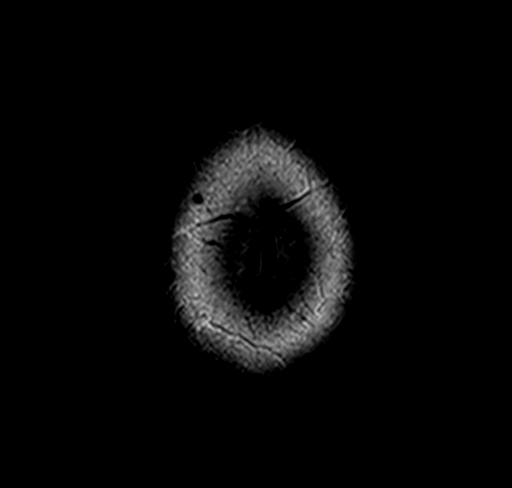

[Series 8: GRE · axial · 5.0mm · 0.45mm/px · z∈[-26,+123]mm · 3 of 24 slices shown]
[im 1/24]
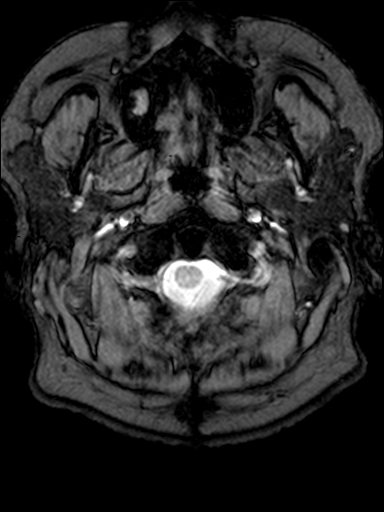
[im 12/24]
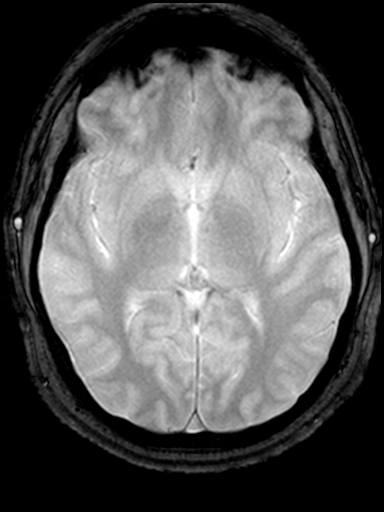
[im 24/24]
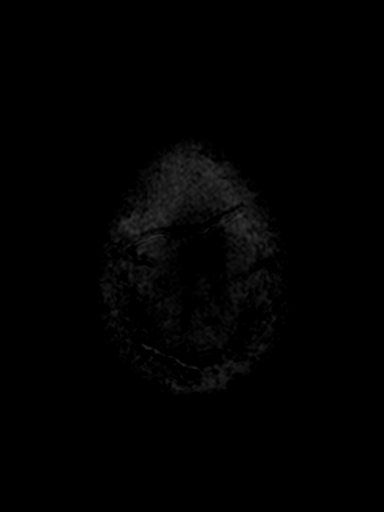

[Series 9: T1 · sagittal · 3.0mm · 0.35mm/px · 1 of 11 slices shown (1 of 2)]
[im 1/11]
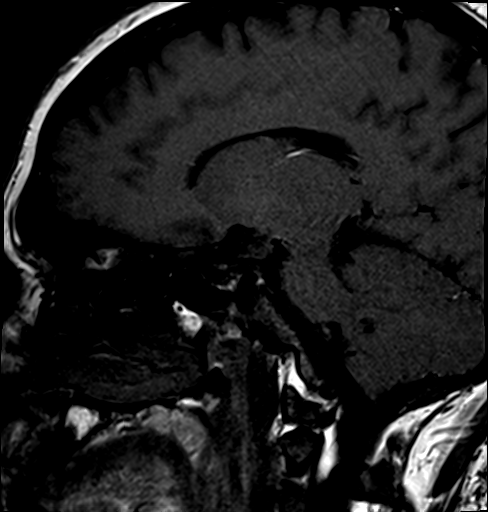

[Series 10: T1 · coronal · 3.0mm · 0.35mm/px · 1 of 11 slices shown (2 of 2)]
[im 1/11]
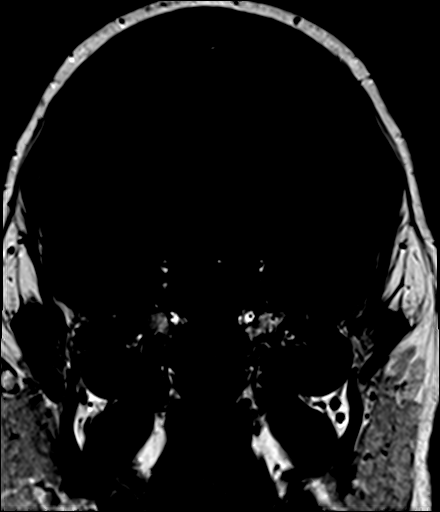

[Series 11: pre cor · coronal · non-contrast · 3.0mm · 0.35mm/px · 1 of 6 slices shown]
[im 1/6]
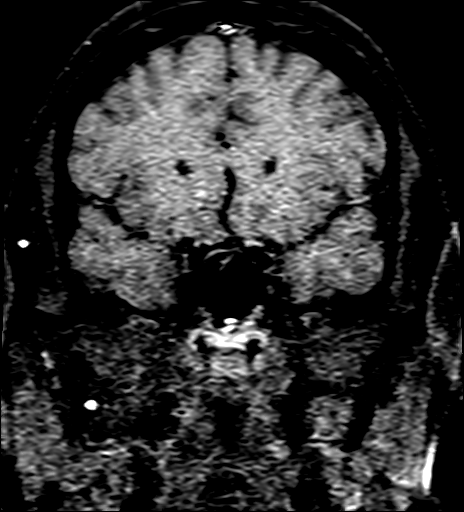

[Series 12: post cor dynamic · coronal · 3.0mm · 0.35mm/px · 1 of 6 slices shown (1 of 4)]
[im 1/6]
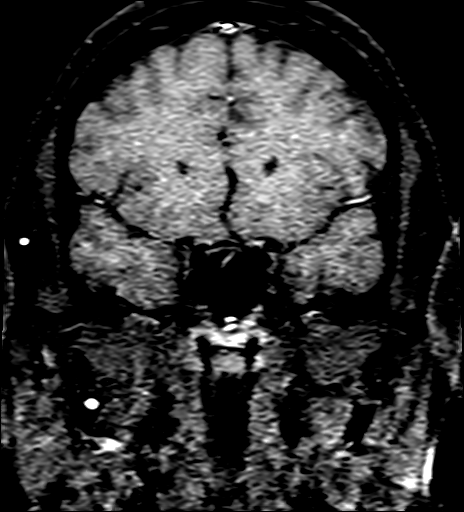

[Series 13: post cor dynamic · coronal · 3.0mm · 0.35mm/px · 1 of 6 slices shown (2 of 4)]
[im 1/6]
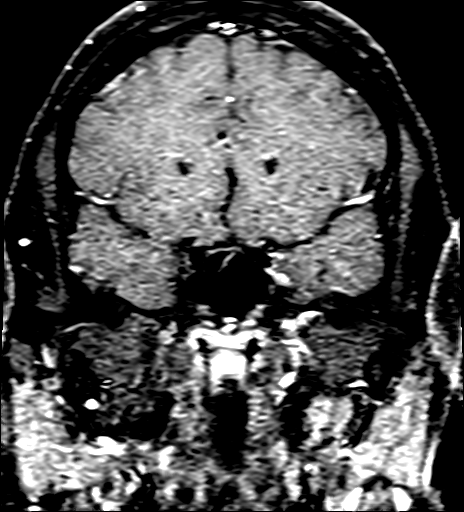

[Series 14: post cor dynamic · coronal · 3.0mm · 0.35mm/px · 1 of 6 slices shown (3 of 4)]
[im 1/6]
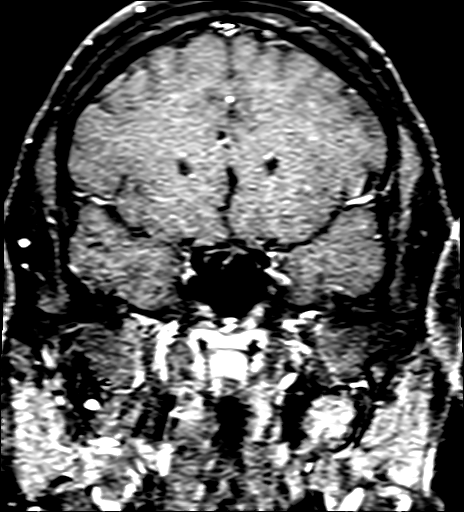

[Series 15: post cor dynamic · coronal · 3.0mm · 0.35mm/px · 1 of 6 slices shown (4 of 4)]
[im 1/6]
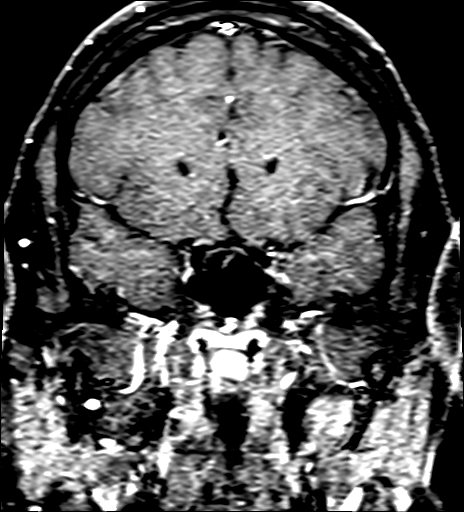

[Series 18: T1 post-contrast · coronal · 3.0mm · 0.35mm/px · 1 of 11 slices shown (1 of 3)]
[im 1/11]
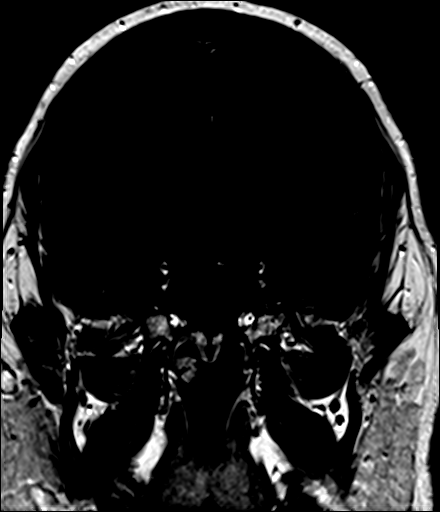

[Series 19: T1 post-contrast · sagittal · 3.0mm · 0.35mm/px · 1 of 11 slices shown (2 of 3)]
[im 1/11]
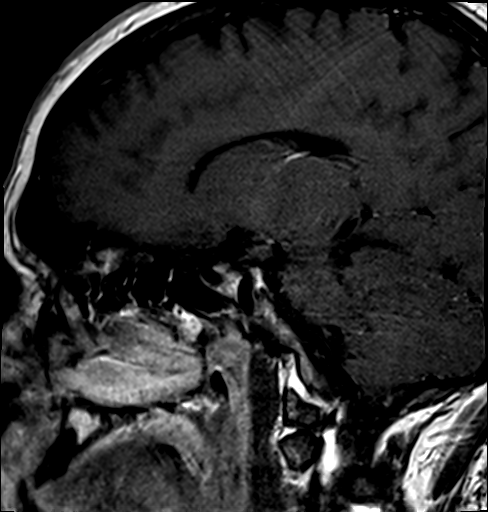

[Series 21: T1 post-contrast · coronal · 5.0mm · 0.69mm/px · 3 of 30 slices shown (3 of 3)]
[im 1/30]
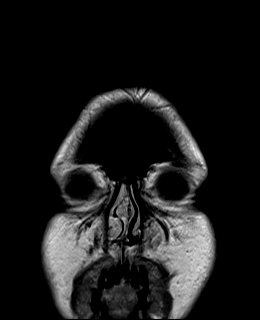
[im 15/30]
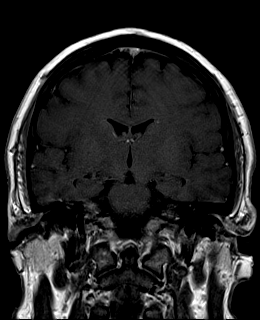
[im 30/30]
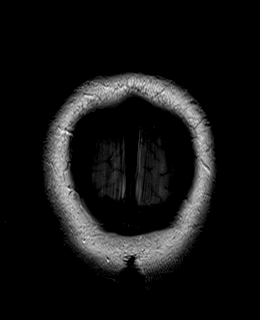

[35 of 48 positions shown; findings below may reference images not displayed]

FINDINGS: Pituitary/Sella: The pituitary gland is normal in appearance without
evidence of mass lesion. A normal posterior pituitary bright spot is
seen. The infundibulum is midline. The hypothalamus and mamillary
bodies are normal.

Brain Parenchyma: No acute infarct or intraparenchymal hemorrhage.
No focal parenchymal signal abnormality. No mass lesion or midline
shift. The major intracranial flow voids are preserved. The midline
structures are normal.

Ventricles, Sulci and Extra-axial Spaces: Normal for age. No
extra-axial collection.

Paranasal Sinuses and Mastoids: Right maxillary retention cyst. No
fluid levels or advanced mucosal thickening.

Orbits: Normal.

Bones and Soft Tissues: The visualized skull base, calvarium and
extracranial soft tissues are normal.
IMPRESSION: Normal MRI of the brain and pituitary gland.

## 2023-05-05 ENCOUNTER — Emergency Department (HOSPITAL_COMMUNITY)
Admission: EM | Admit: 2023-05-05 | Discharge: 2023-05-05 | Disposition: A | Discharge status: Home / Self Care | Payer: 59 | Attending: Emergency Medicine | Admitting: Emergency Medicine

## 2023-05-05 ENCOUNTER — Encounter (HOSPITAL_COMMUNITY): Payer: Self-pay | Admitting: Emergency Medicine

## 2023-05-05 ENCOUNTER — Emergency Department (HOSPITAL_COMMUNITY): Payer: 59

## 2023-05-05 ENCOUNTER — Other Ambulatory Visit: Payer: Self-pay

## 2023-05-05 DIAGNOSIS — N200 Calculus of kidney: Secondary | ICD-10-CM

## 2023-05-05 DIAGNOSIS — N132 Hydronephrosis with renal and ureteral calculous obstruction: Secondary | ICD-10-CM | POA: Diagnosis not present

## 2023-05-05 DIAGNOSIS — R109 Unspecified abdominal pain: Secondary | ICD-10-CM | POA: Diagnosis present

## 2023-05-05 LAB — URINALYSIS, ROUTINE W REFLEX MICROSCOPIC
Bilirubin Urine: NEGATIVE
Glucose, UA: NEGATIVE mg/dL
Ketones, ur: NEGATIVE mg/dL
Leukocytes,Ua: NEGATIVE
Nitrite: NEGATIVE
Protein, ur: 30 mg/dL — AB
RBC / HPF: >50 RBC/hpf (ref 0–5)
Specific Gravity, Urine: 1.019 (ref 1.005–1.030)
pH: 7 (ref 5.0–8.0)

## 2023-05-05 MED ORDER — OXYCODONE HCL 5 MG PO CAPS: 5.0000 mg | ORAL_CAPSULE | Freq: Four times a day (QID) | ORAL | 0 refills | Status: AC | PRN

## 2023-05-05 MED ORDER — IBUPROFEN 800 MG PO TABS: 800.0000 mg | ORAL_TABLET | Freq: Four times a day (QID) | ORAL | Status: DC | PRN

## 2023-05-05 MED ORDER — KETOROLAC TROMETHAMINE 15 MG/ML IJ SOLN
15.0000 mg | Freq: Once | INTRAMUSCULAR | Status: AC
  Administered 2023-05-05: 15 mg via INTRAVENOUS
  Filled 2023-05-05: qty 1

## 2023-05-05 MED ORDER — OXYCODONE HCL 5 MG PO CAPS: 5.0000 mg | ORAL_CAPSULE | Freq: Four times a day (QID) | ORAL | 0 refills | Status: DC | PRN

## 2023-05-05 NOTE — ED Triage Notes (Signed)
Pt in with sharp pain to R flank, radiates down to R groin - woke him up at 0500. Hx of appendectomy, denies any blood in urine.

## 2023-05-05 NOTE — Discharge Instructions (Addendum)
Luke Wise,  It was a pleasure taking care of you while you were in the hospital. Your lower back and groin pain was caused by a kidney stone, also called nephrolithiasis. Our CT scan showed that the stone has already passed into your bladder. We managed your pain with ketorolac and prescribed a short course of oxycodone to continue taking at home if needed. Please ensure that you drink plenty of water and follow up with a urologist. You can call [phone] to schedule an appointment with [facility].   Return to the emergency department if your flank pain returns or you develop any concerning symptoms such as red-colored urine.

## 2023-05-05 NOTE — ED Provider Notes (Signed)
[place] EMERGENCY DEPARTMENT AT [facility] Provider Note   CSN: [id number] Arrival date & time: 05/05/23  4098     History  Hypertriglyceridemia Hypogonadism   Chief Complaint  Patient presents with   Abdominal Pain    L flank pain    Luke Wise is a 32 y.o. male with a past medical history of hypertriglyceridemia and hypogonadism who presents with lower back pain.  Patient awoke this morning to significant pain located on the left side of his lower back. Describes the discomfort as a constant dull achy sensation with associated intermittent episodes of sharp stabbing pain, often radiating into his suprapubic region. He has never previously experienced a similar pain. Reports mild diaphoresis and one loose bowel movement earlier this morning that occurred alongside the onset of his pain. Denies fever, chills, nausea, vomiting, hematuria, and chest or abdominal discomfort. States that he has been eating and drinking normally. Physical activity over the last few days has similarly remained unchanged. Family history notable for several nephrolithiasis events in his older brother. Home medications include metformin and fenofibrate.   HPI     Home Medications Prior to Admission medications   Medication Sig Start Date End Date Taking? Authorizing Provider  clomiPHENE (CLOMID) 50 MG tablet TAKE ONE-FOURTH TABLET BY MOUTH ONCE DAILY 08/14/17   Romero Belling, MD  clomiPHENE (CLOMID) 50 MG tablet TAKE 1/4 (ONE-FOURTH) TABLET BY MOUTH ONCE DAILY 08/14/17   Romero Belling, MD  fenofibrate (TRICOR) 48 MG tablet  10/18/16   [provider]  oxycodone (OXY-IR) 5 MG capsule Take 1 capsule (5 mg total) by mouth every 6 (six) hours as needed for up to 3 days for pain. 05/05/23 05/08/23 Yes Crissie Sickles, MD      Allergies    Amoxicillin and Penicillins    Review of Systems   Review of Systems  Left lower back-flank pain radiating to groin   Physical Exam Updated  Vital Signs BP (!) 124/91 (BP Location: Right Arm)   Pulse 76   Temp 98.6 F (37 C)   Resp 18   Ht 5\' 11"  (1.803 m)   Wt 113.4 kg   SpO2 100%   BMI 34.87 kg/m  Physical Exam  Awake and alert, fully oriented, cooperative, not in acute distress Regular heart rate and rhythm, normal S1 and S2, no murmurs or gallops Breathing unlabored, lungs clear to auscultation, no wheezing or crackles Soft and non-distended abdomen, mild tenderness in suprapubic region No abdominal rigidity or costovertebral tenderness   ED Results / Procedures / Treatments   Labs (all labs ordered are listed, but only abnormal results are displayed) Labs Reviewed  URINALYSIS, ROUTINE W REFLEX MICROSCOPIC - Abnormal; Notable for the following components:      Result Value   APPearance HAZY (*)    Hgb urine dipstick MODERATE (*)    Protein, ur 30 (*)    Bacteria, UA RARE (*)    All other components within normal limits    EKG None  Radiology CT Renal Stone Study  Result Date: 05/05/2023 CLINICAL DATA:  Right flank pain radiating to the right groin. EXAM: CT ABDOMEN AND PELVIS WITHOUT CONTRAST TECHNIQUE: Multidetector CT imaging of the abdomen and pelvis was performed following the standard protocol without IV contrast. RADIATION DOSE REDUCTION: This exam was performed according to the departmental dose-optimization program which includes automated exposure control, adjustment of the mA and/or kV according to patient size and/or use of iterative reconstruction technique. COMPARISON:  None Available. FINDINGS:  Lower chest: The lung bases are clear. The imaged heart is unremarkable. Hepatobiliary: The liver and gallbladder are unremarkable. There is no biliary ductal dilatation. Pancreas: Unremarkable. Spleen: Unremarkable. Adrenals/Urinary Tract: The adrenals are unremarkable. There is mild left hydroureter and hydronephrosis. There is a 3 mm stone dependently in the bladder just to the left of midline. This  appears separate from the UVJ. There is no right hydronephrosis or hydroureter. No other stones are seen in either kidney or along the course of either ureter. There are no suspicious parenchymal lesions, within the confines of noncontrast technique. Stomach/Bowel: The stomach is unremarkable. The there are a few scattered colonic diverticula without evidence of acute diverticulitis. The appendix is surgically absent. Vascular/Lymphatic: The abdominal aorta is normal in course and caliber. There is no abdominopelvic lymphadenopathy. Reproductive: The prostate and seminal vesicles are unremarkable. Other: There is no ascites or free air. Musculoskeletal: There is no acute osseous abnormality or suspicious osseous lesion. IMPRESSION: Mild left hydroureter and hydronephrosis with a 3 mm stone in the bladder which appears separate from the UVJ. No right hydronephrosis or hydroureter. Electronically Signed   By: Lesia Hausen M.D.   On: 05/05/2023 07:56    Procedures Procedures   None   Medications Ordered in ED Medications  ketorolac (TORADOL) 15 MG/ML injection 15 mg (15 mg Intravenous Given 05/05/23 0801)    ED Course/ Medical Decision Making/ A&P   {   Click here for ABCD2, HEART and other calculators                         Medical Decision Making Amount and/or Complexity of Data Reviewed Labs: ordered.  Risk Prescription drug management.   Patient presented with acute left lower back and flank pain that radiates to the groin. Denies fever, chills, nausea, cough, diarrhea, dysuria, hematuria, and abdominal pain. Exam demonstrated lower abdominal and suprapubic tenderness to palpation. Urinalysis notable for protein and moderate hemoglobin. Abdominal CT revealed mild left hydroureter and hydronephrosis with 3mm stone in the bladder. Findings are consistent with nephrolithiasis. Intravenous ketorolac was administered, which provided satisfactory pain relief. Discharged with short course of  oxycodone and instructions to follow up with urology. Tamsulosin considered, but deemed unnecessary given the location and small size of calculus.     Final Clinical Impression(s) / ED Diagnoses Final diagnoses:  Nephrolithiasis    Rx / DC Orders ED Discharge Orders          Ordered    oxycodone (OXY-IR) 5 MG capsule  Every 6 hours PRN        05/05/23 0844              Crissie Sickles, MD 05/05/23 0846    Melene Plan, DO 05/05/23 [phone]

## 2024-11-01 ENCOUNTER — Ambulatory Visit: Payer: Self-pay

## 2024-11-01 NOTE — Telephone Encounter (Signed)
    Copied from CRM [id number]. Topic: Clinical - Red Word Triage >> Nov 01, 2024  8:14 AM Luke Wise wrote:  Red Word that prompted transfer to Nurse Triage: Knee pain when bending lifting and standing- no injury that he is aware of. Started on tuesday. Does not have a pcp Reason for Disposition  [1] MODERATE pain (e.g., interferes with normal activities, limping) AND [2] present > 3 days  Answer Assessment - Initial Assessment Questions Patient calling in to establish care at [facility]. He is experiencing knee pain. When offering new patient appointment patient stated it was too long to wait, advised urgent care for current knee pain, he stated he has a pcp and just wanted to come to [facility] due to distance from his home. Patient plans to call his pcp office that he is establish with, will proceed to urgent care if no access at pcp office.  Offered patient to call back if he decided he would like to establish care.     1. LOCATION and RADIATION: Where is the pain located?      Left knee 2. QUALITY: What does the pain feel like?  (e.g., sharp, dull, aching, burning)      3. SEVERITY: How bad is the pain? What does it keep you from doing?   (Scale 1-10; or mild, moderate, severe)     limping 4. ONSET: When did the pain start? Does it come and go, or is it there all the time?     Wednesday  5. RECURRENT: Have you had this pain before? If Yes, ask: When, and what happened then?     denies 6. SETTING: Has there been any recent work, exercise or other activity that involved that part of the body?      working 7. AGGRAVATING FACTORS: What makes the knee pain worse? (e.g., walking, climbing stairs, running)      8. ASSOCIATED SYMPTOMS: Is there any swelling or redness of the knee?     *No Answer* 9. OTHER SYMPTOMS: Do you have any other symptoms? (e.g., calf pain, chest pain, difficulty breathing, fever)     Slight swelling 10. PREGNANCY: Is there any chance you  are pregnant? When was your last menstrual period?       *No Answer*  Protocols used: Knee Pain-A-AH

## 2024-12-16 ENCOUNTER — Ambulatory Visit (HOSPITAL_COMMUNITY): Payer: Self-pay

## 2024-12-17 ENCOUNTER — Emergency Department

## 2024-12-17 ENCOUNTER — Other Ambulatory Visit: Payer: Self-pay

## 2024-12-17 ENCOUNTER — Inpatient Hospital Stay
Admission: EM | Admit: 2024-12-17 | Discharge: 2024-12-19 | DRG: 439 | Disposition: A | Discharge status: Home / Self Care | Attending: Internal Medicine | Admitting: Internal Medicine

## 2024-12-17 ENCOUNTER — Inpatient Hospital Stay

## 2024-12-17 DIAGNOSIS — K858 Other acute pancreatitis without necrosis or infection: Secondary | ICD-10-CM | POA: Diagnosis not present

## 2024-12-17 DIAGNOSIS — K8500 Idiopathic acute pancreatitis without necrosis or infection: Secondary | ICD-10-CM | POA: Diagnosis not present

## 2024-12-17 DIAGNOSIS — E871 Hypo-osmolality and hyponatremia: Secondary | ICD-10-CM | POA: Diagnosis present

## 2024-12-17 DIAGNOSIS — E781 Pure hyperglyceridemia: Secondary | ICD-10-CM | POA: Diagnosis present

## 2024-12-17 DIAGNOSIS — E291 Testicular hypofunction: Secondary | ICD-10-CM | POA: Diagnosis present

## 2024-12-17 DIAGNOSIS — Z7984 Long term (current) use of oral hypoglycemic drugs: Secondary | ICD-10-CM

## 2024-12-17 DIAGNOSIS — Z79899 Other long term (current) drug therapy: Secondary | ICD-10-CM

## 2024-12-17 DIAGNOSIS — K859 Acute pancreatitis without necrosis or infection, unspecified: Principal | ICD-10-CM | POA: Diagnosis present

## 2024-12-17 DIAGNOSIS — Z8249 Family history of ischemic heart disease and other diseases of the circulatory system: Secondary | ICD-10-CM

## 2024-12-17 DIAGNOSIS — Z888 Allergy status to other drugs, medicaments and biological substances status: Secondary | ICD-10-CM

## 2024-12-17 DIAGNOSIS — Z9049 Acquired absence of other specified parts of digestive tract: Secondary | ICD-10-CM | POA: Diagnosis not present

## 2024-12-17 DIAGNOSIS — K219 Gastro-esophageal reflux disease without esophagitis: Secondary | ICD-10-CM | POA: Diagnosis present

## 2024-12-17 DIAGNOSIS — M1990 Unspecified osteoarthritis, unspecified site: Secondary | ICD-10-CM | POA: Diagnosis present

## 2024-12-17 DIAGNOSIS — Z794 Long term (current) use of insulin: Secondary | ICD-10-CM | POA: Diagnosis not present

## 2024-12-17 DIAGNOSIS — E1165 Type 2 diabetes mellitus with hyperglycemia: Secondary | ICD-10-CM | POA: Diagnosis present

## 2024-12-17 DIAGNOSIS — E119 Type 2 diabetes mellitus without complications: Secondary | ICD-10-CM

## 2024-12-17 DIAGNOSIS — Z880 Allergy status to penicillin: Secondary | ICD-10-CM

## 2024-12-17 HISTORY — DX: Type 2 diabetes mellitus without complications: E11.9

## 2024-12-17 LAB — COMPREHENSIVE METABOLIC PANEL WITH GFR
ALT: 26 U/L (ref 0–44)
AST: 13 U/L — ABNORMAL LOW (ref 15–41)
Albumin: 3.9 g/dL (ref 3.5–5.0)
Alkaline Phosphatase: 58 U/L (ref 38–126)
Anion gap: 16 — ABNORMAL HIGH (ref 5–15)
BUN: 15 mg/dL (ref 6–20)
CO2: 19 mmol/L — ABNORMAL LOW (ref 22–32)
Calcium: 9.3 mg/dL (ref 8.9–10.3)
Chloride: 95 mmol/L — ABNORMAL LOW (ref 98–111)
Creatinine, Ser: 0.81 mg/dL (ref 0.61–1.24)
GFR, Estimated: >60 mL/min
Glucose, Bld: 322 mg/dL — ABNORMAL HIGH (ref 70–99)
Potassium: 4 mmol/L (ref 3.5–5.1)
Sodium: 129 mmol/L — ABNORMAL LOW (ref 135–145)
Total Bilirubin: 0.8 mg/dL (ref 0.0–1.2)
Total Protein: 8.3 g/dL — ABNORMAL HIGH (ref 6.5–8.1)

## 2024-12-17 LAB — CBC
HCT: 44.4 % (ref 39.0–52.0)
Hemoglobin: 14.3 g/dL (ref 13.0–17.0)
MCH: 23.6 pg — ABNORMAL LOW (ref 26.0–34.0)
MCHC: 32.2 g/dL (ref 30.0–36.0)
MCV: 73.1 fL — ABNORMAL LOW (ref 80.0–100.0)
Platelets: 435 K/uL — ABNORMAL HIGH (ref 150–400)
RBC: 6.07 MIL/uL — ABNORMAL HIGH (ref 4.22–5.81)
RDW: 13.2 % (ref 11.5–15.5)
WBC: 23 K/uL — ABNORMAL HIGH (ref 4.0–10.5)
nRBC: 0 % (ref 0.0–0.2)

## 2024-12-17 LAB — LIPID PANEL
Cholesterol: 363 mg/dL — ABNORMAL HIGH (ref 0–200)
HDL: 14 mg/dL — ABNORMAL LOW
LDL Cholesterol: 47 mg/dL (ref 0–99)
Total CHOL/HDL Ratio: 26.9 ratio
Triglycerides: 1980 mg/dL — ABNORMAL HIGH
VLDL: 396 mg/dL — ABNORMAL HIGH (ref 0–40)

## 2024-12-17 LAB — URINALYSIS, W/ REFLEX TO CULTURE (INFECTION SUSPECTED)
Bilirubin Urine: NEGATIVE
Glucose, UA: >=500 mg/dL — AB
Hgb urine dipstick: NEGATIVE
Ketones, ur: 5 mg/dL — AB
Leukocytes,Ua: NEGATIVE
Nitrite: NEGATIVE
Protein, ur: NEGATIVE mg/dL
Specific Gravity, Urine: >1.046 — ABNORMAL HIGH (ref 1.005–1.030)
pH: 5 (ref 5.0–8.0)

## 2024-12-17 LAB — URINALYSIS, ROUTINE W REFLEX MICROSCOPIC
Bilirubin Urine: NEGATIVE
Glucose, UA: >=500 mg/dL — AB
Hgb urine dipstick: NEGATIVE
Ketones, ur: 5 mg/dL — AB
Leukocytes,Ua: NEGATIVE
Nitrite: NEGATIVE
Protein, ur: NEGATIVE mg/dL
Specific Gravity, Urine: >1.046 — ABNORMAL HIGH (ref 1.005–1.030)
pH: 5 (ref 5.0–8.0)

## 2024-12-17 LAB — GLUCOSE, CAPILLARY: Glucose-Capillary: 223 mg/dL — ABNORMAL HIGH (ref 70–99)

## 2024-12-17 LAB — LACTIC ACID, PLASMA: Lactic Acid, Venous: 0.9 mmol/L (ref 0.5–1.9)

## 2024-12-17 LAB — LIPASE, BLOOD: Lipase: 423 U/L — ABNORMAL HIGH (ref 11–51)

## 2024-12-17 LAB — LDL CHOLESTEROL, DIRECT: Direct LDL: 32.3 mg/dL (ref 0–99)

## 2024-12-17 MED ORDER — IOHEXOL 300 MG/ML  SOLN
100.0000 mL | Freq: Once | INTRAMUSCULAR | Status: AC | PRN
  Administered 2024-12-17: 100 mL via INTRAVENOUS

## 2024-12-17 MED ORDER — MORPHINE SULFATE (PF) 4 MG/ML IV SOLN
4.0000 mg | Freq: Once | INTRAVENOUS | Status: AC
  Administered 2024-12-17: 4 mg via INTRAVENOUS
  Filled 2024-12-17: qty 1

## 2024-12-17 MED ORDER — ONDANSETRON HCL 4 MG/2ML IJ SOLN: 4.0000 mg | Freq: Four times a day (QID) | INTRAMUSCULAR | Status: DC | PRN

## 2024-12-17 MED ORDER — MORPHINE SULFATE (PF) 2 MG/ML IV SOLN
2.0000 mg | INTRAVENOUS | Status: DC | PRN
  Administered 2024-12-17 (×2): 2 mg via INTRAVENOUS
  Filled 2024-12-17 (×2): qty 1

## 2024-12-17 MED ORDER — SODIUM CHLORIDE 0.9 % IV SOLN: INTRAVENOUS | Status: DC

## 2024-12-17 MED ORDER — ACETAMINOPHEN 325 MG PO TABS: 650.0000 mg | ORAL_TABLET | Freq: Four times a day (QID) | ORAL | Status: DC | PRN

## 2024-12-17 MED ORDER — INSULIN GLARGINE 100 UNIT/ML ~~LOC~~ SOLN
10.0000 [IU] | Freq: Once | SUBCUTANEOUS | Status: AC
  Administered 2024-12-17: 10 [IU] via SUBCUTANEOUS
  Filled 2024-12-17: qty 0.1

## 2024-12-17 MED ORDER — ACETAMINOPHEN 650 MG RE SUPP: 650.0000 mg | Freq: Four times a day (QID) | RECTAL | Status: DC | PRN

## 2024-12-17 MED ORDER — SODIUM CHLORIDE 0.9 % IV BOLUS
2000.0000 mL | Freq: Once | INTRAVENOUS | Status: AC
  Administered 2024-12-17: 2000 mL via INTRAVENOUS

## 2024-12-17 MED ORDER — ONDANSETRON HCL 4 MG/2ML IJ SOLN
4.0000 mg | Freq: Once | INTRAMUSCULAR | Status: AC
  Administered 2024-12-17: 4 mg via INTRAVENOUS
  Filled 2024-12-17: qty 2

## 2024-12-17 MED ORDER — SODIUM CHLORIDE 0.9 % IV BOLUS
1000.0000 mL | Freq: Once | INTRAVENOUS | Status: AC
  Administered 2024-12-17: 1000 mL via INTRAVENOUS

## 2024-12-17 MED ORDER — POLYETHYLENE GLYCOL 3350 17 G PO PACK: 17.0000 g | PACK | Freq: Every day | ORAL | Status: DC | PRN

## 2024-12-17 MED ORDER — ENOXAPARIN SODIUM 60 MG/0.6ML IJ SOSY
0.5000 mg/kg | PREFILLED_SYRINGE | INTRAMUSCULAR | Status: DC
  Administered 2024-12-17 – 2024-12-18 (×2): 55 mg via SUBCUTANEOUS
  Filled 2024-12-17 (×2): qty 0.6

## 2024-12-17 MED ORDER — ONDANSETRON HCL 4 MG PO TABS: 4.0000 mg | ORAL_TABLET | Freq: Four times a day (QID) | ORAL | Status: DC | PRN

## 2024-12-17 NOTE — H&P (Signed)
 "  History and Physical    Luke Wise FMW:[id number] DOB: [birth year] DOA: 12/17/2024  DOS: the patient was seen and examined on 12/17/2024  PCP: ViaFranky, MD   Patient coming from: Home  I have personally briefly reviewed patient's old medical records in [facility]  Chief Complaint: Abdominal pain for 1 week  HPI: Luke Wise is a pleasant 34 y.o. male with medical history significant for arthritis, DM, HLD, hypogonadism, history of peptic ulcer on  Protonix  who presented to ED with epigastric abdominal pain for 1 week duration.  He stated that his pain was 7/10 in intensity around epigastric and right upper quadrant region, intermittent associated with some nausea no vomiting and radiating to back.  He was thinking that this was his GERD/peptic ulcer causing the problem.  His symptoms worsened after he ate cheeseburger couple days ago.  As it was  getting so severe today,  he was not able to handle and decided to come to the emergency room for evaluation.  He denies any fever or chills.  He denies history of drinking alcohol.  He denies any history of trauma.  However, he stated that he has hypertriglyceridemia and takes medications.  He does not remember the last number.  ED Course: Upon arrival to the ED, patient is found to be tachycardic at 134, lipase was 423, leukocytosis at 23, CT scan of the abdomen and pelvis showed pancreatitis.  Patient was given IV fluid and pain medications.  Hospitalist service was consulted for evaluation for admission.  Review of Systems:  ROS  All other systems negative except as noted in the HPI.  Past Medical History:  Diagnosis Date   Arthritis    Diabetes mellitus without complication (HCC)    Hypertriglyceridemia    Hypogonadism male     Past Surgical History:  Procedure Laterality Date   APPENDECTOMY     SPINE SURGERY       reports that he has never smoked. He has never used smokeless tobacco. No history on file for alcohol use  and drug use.  Allergies[1]  Family History  Problem Relation Age of Onset   Hypertension Mother    Other Neg Hx        hypogonadism    Prior to Admission medications  Medication Sig Start Date End Date Taking? Authorizing Provider  clomiPHENE  (CLOMID ) 50 MG tablet TAKE ONE-FOURTH TABLET BY MOUTH ONCE DAILY 08/14/17   Kassie Mallick, MD  clomiPHENE  (CLOMID ) 50 MG tablet TAKE 1/4 (ONE-FOURTH) TABLET BY MOUTH ONCE DAILY 08/14/17   Kassie Mallick, MD  fenofibrate  (TRICOR ) 48 MG tablet  10/18/16   [provider]    Physical Exam: Vitals:   12/17/24 1240 12/17/24 1250 12/17/24 1300 12/17/24 1435  BP:    130/86  Pulse: 97 (!) 109 (!) 103 (!) 106  Resp:    18  Temp:    99.6 F (37.6 C)  TempSrc:      SpO2: 96% 96% 95% 99%  Weight:      Height:        Physical Exam   Constitutional: Alert, awake, calm, comfortable HEENT: Neck supple Respiratory: Clear to auscultation B/L, no wheezing, no rales.  Cardiovascular: Regular rate and rhythm, no murmurs / rubs / gallops. No extremity edema. 2+ pedal pulses. No carotid bruits.  Abdomen: Soft, moderate epigastric tenderness, Bowel sounds positive.  Musculoskeletal: no clubbing / cyanosis. Good ROM, no contractures. Normal muscle tone.  Skin: no rashes, lesions, ulcers. Neurologic: CN 2-12  grossly intact. Sensation intact, No focal deficit identified Psychiatric: Alert and oriented x 3. Normal mood.    Labs on Admission: I have personally reviewed following labs and imaging studies  CBC: Recent Labs  Lab 12/17/24 1110  WBC 23.0*  HGB 14.3  HCT 44.4  MCV 73.1*  PLT 435*   Basic Metabolic Panel: Recent Labs  Lab 12/17/24 1110  NA 129*  K 4.0  CL 95*  CO2 19*  GLUCOSE 322*  BUN 15  CREATININE 0.81  CALCIUM  9.3   GFR: Estimated Creatinine Clearance: 162.4 mL/min (by C-G formula based on SCr of 0.81 mg/dL). Liver Function Tests: Recent Labs  Lab 12/17/24 1110  AST 13*  ALT 26  ALKPHOS 58  BILITOT 0.8   PROT 8.3*  ALBUMIN 3.9   Recent Labs  Lab 12/17/24 1110  LIPASE 423*   No results for input(s): AMMONIA in the last 168 hours. Coagulation Profile: No results for input(s): INR, PROTIME in the last 168 hours. Cardiac Enzymes: No results for input(s): CKTOTAL, CKMB, CKMBINDEX, TROPONINI, TROPONINIHS in the last 168 hours. BNP (last 3 results) No results for input(s): BNP in the last 8760 hours. HbA1C: No results for input(s): HGBA1C in the last 72 hours. CBG: No results for input(s): GLUCAP in the last 168 hours. Lipid Profile: No results for input(s): CHOL, HDL, LDLCALC, TRIG, CHOLHDL, LDLDIRECT in the last 72 hours. Thyroid  Function Tests: No results for input(s): TSH, T4TOTAL, FREET4, T3FREE, THYROIDAB in the last 72 hours. Anemia Panel: No results for input(s): VITAMINB12, FOLATE, FERRITIN, TIBC, IRON, RETICCTPCT in the last 72 hours. Urine analysis:    Component Value Date/Time   COLORURINE YELLOW 05/05/2023 0622   APPEARANCEUR HAZY (A) 05/05/2023 0622   LABSPEC 1.019 05/05/2023 0622   PHURINE 7.0 05/05/2023 0622   GLUCOSEU NEGATIVE 05/05/2023 0622   HGBUR MODERATE (A) 05/05/2023 0622   BILIRUBINUR NEGATIVE 05/05/2023 0622   KETONESUR NEGATIVE 05/05/2023 0622   PROTEINUR 30 (A) 05/05/2023 0622   NITRITE NEGATIVE 05/05/2023 0622   LEUKOCYTESUR NEGATIVE 05/05/2023 0622    Radiological Exams on Admission: I have personally reviewed images US  Abdomen Limited RUQ (LIVER/GB) Result Date: 12/17/2024 CLINICAL DATA:  Right upper quadrant pain. EXAM: ULTRASOUND ABDOMEN LIMITED RIGHT UPPER QUADRANT COMPARISON:  None Available. FINDINGS: Gallbladder: No gallstones or wall thickening visualized. No sonographic Murphy sign noted by sonographer. Common bile duct: Diameter: 4 mm, within normal limits. No intrahepatic biliary ductal dilatation. Liver: Diffusely increased in echogenicity with probable sparing along the gallbladder  fossa. No focal lesion. Portal vein is patent on color Doppler imaging with normal direction of blood flow towards the liver. Other: None. IMPRESSION: 1. No acute findings. 2. Hepatic steatosis. Electronically Signed   By: Newell Eke M.D.   On: 12/17/2024 14:25   CT ABDOMEN PELVIS W CONTRAST Result Date: 12/17/2024 CLINICAL DATA:  Abdominal pain x1 week. EXAM: CT ABDOMEN AND PELVIS WITH CONTRAST TECHNIQUE: Multidetector CT imaging of the abdomen and pelvis was performed using the standard protocol following bolus administration of intravenous contrast. RADIATION DOSE REDUCTION: This exam was performed according to the departmental dose-optimization program which includes automated exposure control, adjustment of the mA and/or kV according to patient size and/or use of iterative reconstruction technique. CONTRAST:  OMNIPAQUE  IOHEXOL  300 MG/ML  SOLN COMPARISON:  May 05, 2023 FINDINGS: Lower chest: No acute abnormality. Hepatobiliary: There is diffuse fatty infiltration of the liver parenchyma. No focal liver abnormality is seen. No gallstones, gallbladder wall thickening, or biliary dilatation. Pancreas: There is  diffuse pancreatic head enlargement with a moderate amount of associated peripancreatic inflammatory fat stranding. This extends to involve the adjacent portion of the proximal duodenum. No pancreatic ductal dilatation is identified. Spleen: Normal in size without focal abnormality. Adrenals/Urinary Tract: Adrenal glands are unremarkable. Kidneys are normal, without renal calculi, focal lesion, or hydronephrosis. Bladder is unremarkable. Stomach/Bowel: Stomach is within normal limits. The appendix is surgically absent. No evidence of bowel wall thickening, distention, or inflammatory changes. Vascular/Lymphatic: No significant vascular findings are present. No enlarged abdominal or pelvic lymph nodes. Reproductive: Prostate is unremarkable. Other: No abdominal wall hernia or abnormality. No  abdominopelvic ascites. Musculoskeletal: No acute or significant osseous findings. IMPRESSION: 1. Findings consistent with moderate severity acute pancreatitis. 2. Hepatic steatosis. 3. Evidence of prior appendectomy. Electronically Signed   By: Suzen Dials M.D.   On: 12/17/2024 13:12    EKG: My personal interpretation of EKG shows: Sinus tachycardia at 132 bpm    Assessment/Plan Principal Problem:   Acute pancreatitis Active Problems:   Hypogonadism male   Hypertriglyceridemia   GERD (gastroesophageal reflux disease)   DM (diabetes mellitus) (HCC)    Assessment and Plan: 34 year old male with history of diabetes, HLD, GERD, hypogonadism who came into ED complaining of epigastric abdominal pain, elevated lipase and CT scan finding of acute pancreatitis.  1.  Acute pancreatitis in the setting of hypertriglyceridemia - Patient does not drink alcohol and does not have gallbladder stones. - This could be related to hypertriglyceridemia. - He will be admitted to hospital as inpatient. - He will be n.p.o., IV fluid, pain medications - There is no necrosis or pseudocyst. - Will check lipid panel  2.  History of GERD/peptic ulcer disease - Will give him Protonix  and Maalox.  3.  Hypertriglyceridemia - Will resume his fenofibrate . - Check lipid panel  4.  Type 2 diabetes - Will give him Lantus  10 units daily and insulin  sliding scale. - Continue to monitor blood glucose.  5.  Hypogonadism - Resume his home medication once discharged from the hospital  6.  Leukocytosis - There is no signs of sepsis - Will check blood cultures UA and chest x-ray. - No need for antibiotic at this point  7.  Hyponatremia at 129 - May be related to decreased p.o. intake - He is on IV fluid. - Continue to monitor sodium level.      DVT prophylaxis: Lovenox  Code Status: Full Code Family Communication: Family at bedside Disposition Plan: Home Consults called: None Admission status:  Inpatient, telemetry   Nena Rebel, MD [facility] 12/17/2024, 2:57 PM        [1]  Allergies Allergen Reactions   Amoxicillin Hives    RASH    Clavulanic Acid Hives   Penicillins Hives    RASH   "

## 2024-12-17 NOTE — Progress Notes (Addendum)
 Subjective Patient ID: Luke Wise is a 34 y.o. male.    Clay T Tengan is a 34 y.o. male w/ hx of DM II, GERD who presents to the clinic for evaluation of waxing and waning, constant dull aching epigastric stomach pain over the past 6 days. Associated symptoms as listed per Ros. Currently the pain is a 01/01/09 (0-10) and at its worst the pain is 8/10 (0-10). Notes that he at a burger after feeling better and the symptoms flare up. States that he had suspected COVID x 2 weeks ago. States that he has tried Pantoprazole  for his GERD. Stats that he got Pepcid and Omeprazole and tried that in place of Protonix  with no relief. States that he did not eat anything this morning and had a little bit of gatorade. Denies ever being tested for H.pylori to his knowledge.       History provided by:  Patient Language interpreter used: No     Review of Systems  Constitutional:  Positive for fever (subj) and unexpected weight change. Negative for appetite change, chills and fatigue.  HENT:  Negative for congestion, rhinorrhea and sore throat.   Respiratory:  Negative for cough, shortness of breath and wheezing.   Cardiovascular:  Negative for chest pain.  Gastrointestinal:  Positive for abdominal pain, constipation and nausea. Negative for diarrhea and vomiting.       Satiety (early)  Genitourinary:  Negative for dysuria, hematuria and urgency.  Musculoskeletal:  Positive for back pain.  Skin:  Negative for rash.  Neurological:  Negative for dizziness and headaches.    Patient History  Allergies: Allergies  Allergen Reactions   Amoxicillin    Penicillins Hives    RASH RASH      Past Medical History:  Diagnosis Date   Diabetes mellitus    GERD (gastroesophageal reflux disease)    History reviewed. No pertinent surgical history. Social History   Socioeconomic History   Marital status: Married    Spouse name: Not on file   Number of children: Not on file   Years of  education: Not on file   Highest education level: Not on file  Occupational History   Not on file  Tobacco Use   Smoking status: Never   Smokeless tobacco: Never  Vaping Use   Vaping status: Never Used  Substance and Sexual Activity   Alcohol use: Never   Drug use: Never   Sexual activity: Yes    Partners: Female  Other Topics Concern   Not on file  Social History Narrative   Not on file   History reviewed. No pertinent family history. Current Outpatient Medications on File Prior to Visit  Medication Sig Dispense Refill   fenofibrate  (Triglide ) 160 MG tablet      metFORMIN XR (Glucophage-XR) 500 MG 24 hr tablet Take 500 mg by mouth 1 (one) time each day with dinner.     brompheniramine-pseudoephedrine -DM 30-2-10 MG/5ML syrup Take 10 mL by mouth at night if needed for allergies for up to 12 doses. (Patient not taking: Reported on 12/17/2024) 120 mL 0   clomiPHENE  (Clomid ) 50 MG tablet Take 50 mg by mouth 1 (one) time each day. (Patient not taking: Reported on 12/17/2024)     cyclobenzaprine  (Flexeril ) 10 MG tablet Take 1 tablet (10 mg total) by mouth in the morning and 1 tablet (10 mg total) in the evening and 1 tablet (10 mg total) before bedtime. Do all this for 10 days. (Patient not taking: Reported on 12/17/2024)  30 tablet 0   Diclofenac  Sodium 1 % gel Apply 2 g topically 3 (three) times a day if needed (for mild to moderate pain relief). (Patient not taking: Reported on 12/17/2024) 100 g 0   EPINEPHrine (Epipen) 0.3 MG/0.3ML injection syringe Inject 0.3 mL (0.3 mg total) as directed 1 (one) time if needed for anaphylaxis for up to 1 dose. Inject into upper leg. Call 911 after use. (Patient not taking: Reported on 12/17/2024) 1 each 0   ergocalciferol (Vitamin D-2) 1.25 MG (50000 UT) capsule Take 50,000 Units by mouth 1 (one) time per week. (Patient not taking: Reported on 12/17/2024)     hydrOXYzine pamoate (Vistaril) 25 MG capsule Take 1 capsule (25 mg total) by mouth  3 (three) times a day if needed for itching for up to 10 days. (Patient not taking: Reported on 11/01/2024) 30 capsule 0   pantoprazole  (ProtoNix ) 40 MG EC tablet Take 40 mg by mouth 1 (one) time each day before breakfast. Do not crush, chew, or split. (Patient not taking: Reported on 12/17/2024)     pantoprazole  (ProtoNix ) 40 MG EC tablet Take 40 mg by mouth 1 (one) time each day. (Patient not taking: Reported on 12/17/2024)     No current facility-administered medications on file prior to visit.    Objective  Vitals:   12/17/24 1008 12/17/24 1046  BP: 117/84   Pulse: (!) 136 (!) 130  Resp: 16   Temp: 37.2 C (98.9 F)   TempSrc: Tympanic   SpO2: 97%   Weight: 106 kg   Height: 5' 11   PainSc:   3                No results found.  Physical Exam Vitals and nursing note reviewed.  Constitutional:      General: He is not in acute distress.    Appearance: Normal appearance. He is obese. He is not ill-appearing, toxic-appearing or diaphoretic.  HENT:     Head: Normocephalic and atraumatic.     Right Ear: Tympanic membrane, ear canal and external ear normal.     Left Ear: Tympanic membrane, ear canal and external ear normal.     Nose: Nose normal. No congestion or rhinorrhea.     Mouth/Throat:     Mouth: Mucous membranes are moist.     Pharynx: Oropharynx is clear. No oropharyngeal exudate or posterior oropharyngeal erythema.  Eyes:     General:        Right eye: No discharge.        Left eye: No discharge.     Conjunctiva/sclera: Conjunctivae normal.  Cardiovascular:     Rate and Rhythm: Regular rhythm. Tachycardia present.     Heart sounds: Normal heart sounds.  Pulmonary:     Effort: Pulmonary effort is normal. No respiratory distress.     Breath sounds: Normal breath sounds. No wheezing, rhonchi or rales.  Abdominal:     General: Abdomen is flat. There is no distension.     Palpations: Abdomen is soft. There is no mass.     Tenderness: There is abdominal  tenderness in the epigastric area. There is no guarding or rebound.  Musculoskeletal:     Cervical back: Neck supple. No rigidity.  Skin:    General: Skin is warm.     Findings: No rash.  Neurological:     General: No focal deficit present.     Mental Status: He is alert.  Psychiatric:        Mood  and Affect: Mood normal.        Behavior: Behavior normal.     Results for orders placed or performed in visit on 12/17/24  POCT urinalysis dipstick manually resulted  Component Result   Color, UA Yellow   Clarity, UA Cloudy (A)   Glucose, UA 3+ (A)    Comment: 1000mg /dl   Bilirubin, UA Detected (A)    Comment: 3+/4mg /dl   Ketones, UA 1+ (A)    Comment: 15mg /dl   Spec Grav, UA 8.979   Blood in Urine, POC Negative   pH, UA 6.0   Protein, UA 1+ (A)    Comment: 30mg /dl   Urobilinogen, UA Negative    Comment: 0.2mg /dl   Leukocytes, UA Negative   Nitrite, UA Negative  POCT Glucose, Manually Resulted  Component Result   Glucose Blood, POC 301 (A)    Comment: 0       Procedures MDM:     1+ Acute illness or injury that poses a threat to life or bodily function     1+ Chronic illness with exacerbation, progression or side effects of treatment     Explanation of Medical Decision Making and variances from expected care:    62 y/ o M presents with waxing and waning epigastric abdominal pain; max intensity pain 8/10 (0-10). Associated constipation, nausea, early satiety, mid back pain, subj fever, wt loss 8 lbs over the past month.  Hx of DM II, elevated blood sugar 301, ketonuria on UA today along wit glucosuria.  Abdominal exam remarkable for epigastric tenderness.  Noted tachycardia on examination. Afebrile in clinic.  Differential diagnoses include but not limited to perforated, bleeding or penetrating gastric ulcer, acute gastritis, DKA, acute pancreatitis, acute cholecystitis Needs further evaluation in the ED with labs and +/- imaging. Patient agreeable with plan.      Unique ordered tests: Two     Review of any test results: Two     Assessment requiring historian other than patient: No     Independent visualization of image, tracing, or test: No     Discussion of management with another provider: No     Risk:: High            Assessment/Plan Diagnoses and all orders for this visit:  Epigastric abdominal pain -     POCT urinalysis dipstick manually resulted -     Referral to Emergency Medicine; Future  Type 2 diabetes mellitus treated without insulin  -     POCT Glucose, Manually Resulted -     Referral to Emergency Medicine; Future  Ketonuria -     Referral to Emergency Medicine; Future  History of gastric ulcer   Results for orders placed or performed in visit on 12/17/24  POCT urinalysis dipstick manually resulted   Collection Time: 12/17/24 10:18 AM  Result Value Ref Range   Color, UA Yellow Green, Yellow, Straw, Clear   Clarity, UA Cloudy (A) Clear   Glucose, UA 3+ (A) Negative, Trace   Bilirubin, UA Detected (A) Undetected, Indeterminate   Ketones, UA 1+ (A) Negative   Spec Grav, UA 1.020 1.010, 1.015, 1.020, 1.025, Unable to interpret due to interfering substance   Blood in Urine, POC Negative Negative   pH, UA 6.0 5.0, 5.5, 6.0, 6.5, 7.0, 7.5, 8.0, 4.5   Protein, UA 1+ (A) Negative   Urobilinogen, UA Negative Negative   Leukocytes, UA Negative Negative   Nitrite, UA Negative Negative  POCT Glucose, Manually Resulted   Collection Time: 12/17/24 10:30 AM  Result Value Ref Range   Glucose Blood, POC 301 (A) 70 - 140 mg/dL      Disposition Status: Emergency Department  Patient Instructions  Please be advised that evaluating abdominal pain in an urgent care setting has certain limitations. While we strive to provide thorough and timely care, urgent care facilities may not have access to advanced imaging or specialized diagnostic tools that are sometimes necessary to fully assess abdominal conditions. Abdominal pain can be  a complex and non-specific symptom with a wide range of potential causes--from minor to serious. We strongly recommend seeking further evaluation in an emergency department for a more comprehensive assessment. Your health and safety are our top priorities.   Progress note signed by Alm Prophet, PA on 12/17/24 at 11:35 AM

## 2024-12-17 NOTE — Progress Notes (Signed)
 Possible stomach ulcer painful/ 6 days

## 2024-12-17 NOTE — ED Triage Notes (Signed)
 Pt to ED from UC for abd pain x 1 week. Denies n/v/d. Unsure of fevers.

## 2024-12-17 NOTE — ED Notes (Addendum)
 Pt c/o epigastric pain and tenderness in the RUQ to palpation. Pt AOX4, NAD noted.

## 2024-12-17 NOTE — ED Provider Notes (Signed)
 "   [facility] Emergency Department Provider Note     Event Date/Time   First MD Initiated Contact with Patient 12/17/24 1123     (approximate)   History   Abdominal Pain   HPI  Luke Wise is a 34 y.o. male with a history of DM type II, HLD, hypogonadism, and arthritis, presents to the ED with 1 week complaint of right upper quadrant and epigastric abdominal pain.  He notes anorexia, and nausea but no reported vomiting, diarrhea, or fevers.  Patient reports symptoms increased sharply few days ago after he ate a cheeseburger.  He has had poor p.o. intake since that time.  He gives a remote surgical history of appendectomy.  Patient denies EtOH intake, prior pancreatitis, or history of hepatitis.  Physical Exam   Triage Vital Signs: ED Triage Vitals [12/17/24 1108]  Encounter Vitals Group     BP (!) 118/90     Girls Systolic BP Percentile      Girls Diastolic BP Percentile      Boys Systolic BP Percentile      Boys Diastolic BP Percentile      Pulse Rate (!) 134     Resp 20     Temp 98.2 F (36.8 C)     Temp src      SpO2 97 %     Weight 239 lb (108.4 kg)     Height 5' 11 (1.803 m)     Head Circumference      Peak Flow      Pain Score 4     Pain Loc      Pain Education      Exclude from Growth Chart     Most recent vital signs: Vitals:   12/17/24 1250 12/17/24 1300  BP:    Pulse: (!) 109 (!) 103  Resp:    Temp:    SpO2: 96% 95%    General Awake, no distress.  NAD HEENT NCAT. PERRL. EOMI. No rhinorrhea. Mucous membranes are moist.  CV:  Good peripheral perfusion. Tachy rate. Normal S1S2 RESP:  Normal effort. CTA ABD:  No distention.  Mildly tender to palpation over the epigastrium and right upper quadrant.  No rebound, guarding, or rigidity noted.  No organomegaly appreciated.  Normal bowel sounds x   ED Results / Procedures / Treatments   Labs (all labs ordered are listed, but only abnormal results are displayed) Labs  Reviewed  LIPASE, BLOOD - Abnormal; Notable for the following components:      Result Value   Lipase 423 (*)    All other components within normal limits  COMPREHENSIVE METABOLIC PANEL WITH GFR - Abnormal; Notable for the following components:   Sodium 129 (*)    Chloride 95 (*)    CO2 19 (*)    Glucose, Bld 322 (*)    Total Protein 8.3 (*)    AST 13 (*)    Anion gap 16 (*)    All other components within normal limits  CBC - Abnormal; Notable for the following components:   WBC 23.0 (*)    RBC 6.07 (*)    MCV 73.1 (*)    MCH 23.6 (*)    Platelets 435 (*)    All other components within normal limits  LACTIC ACID, PLASMA  URINALYSIS, ROUTINE W REFLEX MICROSCOPIC  HIV ANTIBODY (ROUTINE TESTING W REFLEX)  CBC  CREATININE, SERUM    EKG  Vent. rate 132  BPM PR interval  146  ms QRS duration  96 ms QT/QTcB  304/450 ms  P-R-T axes 54 -50 38  Sinus tachycardia  Pulmonary disease pattern  Left anterior fascicular block  Abnormal ECG  No STEMI  RADIOLOGY  I personally viewed and evaluated these images as part of my medical decision making, as well as reviewing the written report by the radiologist.  ED Provider Interpretation: Acute pancreatitis without evidence of necrosis or abscess; no evidence of acute cholecystitis or cholelithiasis  CT ABDOMEN PELVIS W CONTRAST Result Date: 12/17/2024 CLINICAL DATA:  Abdominal pain x1 week. EXAM: CT ABDOMEN AND PELVIS WITH CONTRAST TECHNIQUE: Multidetector CT imaging of the abdomen and pelvis was performed using the standard protocol following bolus administration of intravenous contrast. RADIATION DOSE REDUCTION: This exam was performed according to the departmental dose-optimization program which includes automated exposure control, adjustment of the mA and/or kV according to patient size and/or use of iterative reconstruction technique. CONTRAST:  OMNIPAQUE  IOHEXOL  300 MG/ML  SOLN COMPARISON:  May 05, 2023 FINDINGS: Lower chest: No  acute abnormality. Hepatobiliary: There is diffuse fatty infiltration of the liver parenchyma. No focal liver abnormality is seen. No gallstones, gallbladder wall thickening, or biliary dilatation. Pancreas: There is diffuse pancreatic head enlargement with a moderate amount of associated peripancreatic inflammatory fat stranding. This extends to involve the adjacent portion of the proximal duodenum. No pancreatic ductal dilatation is identified. Spleen: Normal in size without focal abnormality. Adrenals/Urinary Tract: Adrenal glands are unremarkable. Kidneys are normal, without renal calculi, focal lesion, or hydronephrosis. Bladder is unremarkable. Stomach/Bowel: Stomach is within normal limits. The appendix is surgically absent. No evidence of bowel wall thickening, distention, or inflammatory changes. Vascular/Lymphatic: No significant vascular findings are present. No enlarged abdominal or pelvic lymph nodes. Reproductive: Prostate is unremarkable. Other: No abdominal wall hernia or abnormality. No abdominopelvic ascites. Musculoskeletal: No acute or significant osseous findings. IMPRESSION: 1. Findings consistent with moderate severity acute pancreatitis. 2. Hepatic steatosis. 3. Evidence of prior appendectomy. Electronically Signed   By: Suzen Dials M.D.   On: 12/17/2024 13:12     PROCEDURES:  Critical Care performed: Yes  CRITICAL CARE Performed by: Candida LULLA Bast   Total critical care time: 30 minutes  Critical care time was exclusive of separately billable procedures and treating other patients.  Critical care was necessary to treat or prevent imminent or life-threatening deterioration.  Critical care was time spent personally by me on the following activities: development of treatment plan with patient and/or surrogate as well as nursing, discussions with consultants, evaluation of patient's response to treatment, examination of patient, obtaining history from patient or  surrogate, ordering and performing treatments and interventions, ordering and review of laboratory studies, ordering and review of radiographic studies, pulse oximetry and re-evaluation of patient's condition.   Procedures   MEDICATIONS ORDERED IN ED: Medications  sodium chloride  0.9 % bolus 2,000 mL (has no administration in time range)  enoxaparin  (LOVENOX ) injection 40 mg (has no administration in time range)  0.9 %  sodium chloride  infusion (has no administration in time range)  acetaminophen  (TYLENOL ) tablet 650 mg (has no administration in time range)    Or  acetaminophen  (TYLENOL ) suppository 650 mg (has no administration in time range)  morphine  (PF) 2 MG/ML injection 2 mg (has no administration in time range)  polyethylene glycol (MIRALAX  / GLYCOLAX ) packet 17 g (has no administration in time range)  ondansetron  (ZOFRAN ) tablet 4 mg (has no administration in time range)    Or  ondansetron  (ZOFRAN ) injection 4 mg (  has no administration in time range)  ondansetron  (ZOFRAN ) injection 4 mg (4 mg Intravenous Given 12/17/24 1203)  morphine  (PF) 4 MG/ML injection 4 mg (4 mg Intravenous Given 12/17/24 1204)  iohexol  (OMNIPAQUE ) 300 MG/ML solution 100 mL (100 mLs Intravenous Contrast Given 12/17/24 1217)  sodium chloride  0.9 % bolus 1,000 mL (1,000 mLs Intravenous New Bag/Given 12/17/24 1231)     IMPRESSION / MDM / ASSESSMENT AND PLAN / ED COURSE  I reviewed the triage vital signs and the nursing notes.                              Differential diagnosis includes, but is not limited to,  biliary disease (biliary colic, acute cholecystitis, cholangitis, choledocholithiasis, etc), intrathoracic causes for epigastric abdominal pain including ACS, gastritis, duodenitis, pancreatitis, small bowel or large bowel obstruction, abdominal aortic aneurysm, hernia, and ulcer(s).   Patient's presentation is most consistent with acute complicated illness / injury requiring diagnostic  workup.  Patient's diagnosis is consistent with acute pancreatitis.  Patient presents with a week of right upper quadrant abdominal pain as well as some epigastric discomfort anorexia and nausea without vomiting.  He presents to the ED afebrile in no acute distress.  Patient with tachycardic on presentation.  Lab workup reveals elevated WBCs at 23 as well as a lipase at 423.  Mildly elevated anion gap at 16 and hyponatremia at 129.  CT abdomen pelvis reveals acute pancreatitis without abscess or necrosis.  Patient will be admitted to the hospital service for ongoing evaluation management of his acute pancreatitis.  Patient agreeable to plan of admission at this time.     Clinical Course as of 12/17/24 1344  Mon Dec 17, 2024  1232 Patient with elevated lipase at 423 as well as hyponatremia at 129.  Blood sugar elevated at 322.  WBCs elevated at 23.  CT scan and right upper quadrant ultrasound pending at this time. [JM]  1332 Patient's CT scan with contrast, confirming acute pancreatitis.  No evidence of abscess or necrosis. [JM]    Clinical Course User Index [JM] Ilyana Manuele, Candida LULLA Kings, PA-C    FINAL CLINICAL IMPRESSION(S) / ED DIAGNOSES   Final diagnoses:  Acute pancreatitis without infection or necrosis, unspecified pancreatitis type  Hyponatremia     Rx / DC Orders   ED Discharge Orders     None        Note:  This document was prepared using Dragon voice recognition software and may include unintentional dictation errors.    Loyd Candida LULLA Kings, PA-C 12/17/24 1419  "

## 2024-12-18 DIAGNOSIS — K858 Other acute pancreatitis without necrosis or infection: Secondary | ICD-10-CM

## 2024-12-18 LAB — CBC
HCT: 38 % — ABNORMAL LOW (ref 39.0–52.0)
Hemoglobin: 12.1 g/dL — ABNORMAL LOW (ref 13.0–17.0)
MCH: 23.7 pg — ABNORMAL LOW (ref 26.0–34.0)
MCHC: 31.8 g/dL (ref 30.0–36.0)
MCV: 74.5 fL — ABNORMAL LOW (ref 80.0–100.0)
Platelets: 378 K/uL (ref 150–400)
RBC: 5.1 MIL/uL (ref 4.22–5.81)
RDW: 13.2 % (ref 11.5–15.5)
WBC: 17.9 K/uL — ABNORMAL HIGH (ref 4.0–10.5)
nRBC: 0 % (ref 0.0–0.2)

## 2024-12-18 LAB — COMPREHENSIVE METABOLIC PANEL WITH GFR
ALT: 19 U/L (ref 0–44)
AST: 11 U/L — ABNORMAL LOW (ref 15–41)
Albumin: 3.4 g/dL — ABNORMAL LOW (ref 3.5–5.0)
Alkaline Phosphatase: 52 U/L (ref 38–126)
Anion gap: 13 (ref 5–15)
BUN: 13 mg/dL (ref 6–20)
CO2: 21 mmol/L — ABNORMAL LOW (ref 22–32)
Calcium: 8.6 mg/dL — ABNORMAL LOW (ref 8.9–10.3)
Chloride: 101 mmol/L (ref 98–111)
Creatinine, Ser: 0.7 mg/dL (ref 0.61–1.24)
GFR, Estimated: >60 mL/min
Glucose, Bld: 201 mg/dL — ABNORMAL HIGH (ref 70–99)
Potassium: 3.7 mmol/L (ref 3.5–5.1)
Sodium: 134 mmol/L — ABNORMAL LOW (ref 135–145)
Total Bilirubin: 0.4 mg/dL (ref 0.0–1.2)
Total Protein: 6.9 g/dL (ref 6.5–8.1)

## 2024-12-18 LAB — GLUCOSE, CAPILLARY
Glucose-Capillary: 196 mg/dL — ABNORMAL HIGH (ref 70–99)
Glucose-Capillary: 217 mg/dL — ABNORMAL HIGH (ref 70–99)

## 2024-12-18 LAB — TRIGLYCERIDES: Triglycerides: 1080 mg/dL — ABNORMAL HIGH

## 2024-12-18 LAB — LIPASE, BLOOD: Lipase: 158 U/L — ABNORMAL HIGH (ref 11–51)

## 2024-12-18 LAB — HIV ANTIBODY (ROUTINE TESTING W REFLEX): HIV Screen 4th Generation wRfx: NONREACTIVE

## 2024-12-18 MED ORDER — SODIUM CHLORIDE 0.9 % IV SOLN: INTRAVENOUS | Status: AC

## 2024-12-18 MED ORDER — FENOFIBRATE 54 MG PO TABS
200.0000 mg | ORAL_TABLET | Freq: Every day | ORAL | Status: DC
  Administered 2024-12-19: 189 mg via ORAL
  Filled 2024-12-18: qty 3.5

## 2024-12-18 MED ORDER — PANTOPRAZOLE SODIUM 40 MG PO TBEC
40.0000 mg | DELAYED_RELEASE_TABLET | Freq: Every day | ORAL | Status: DC
  Administered 2024-12-19: 40 mg via ORAL
  Filled 2024-12-18: qty 1

## 2024-12-18 NOTE — Inpatient Diabetes Management (Signed)
 Inpatient Diabetes Program Recommendations  AACE/ADA: New Consensus Statement on Inpatient Glycemic Control   Target Ranges:  Prepandial:   less than 140 mg/dL      Peak postprandial:   less than 180 mg/dL (1-2 hours)      Critically ill patients:  140 - 180 mg/dL    Latest Reference Range & Units 12/17/24 21:01 12/18/24 08:08  Glucose-Capillary 70 - 99 mg/dL 776 (H) 803 (H)    Latest Reference Range & Units 12/17/24 11:10 12/18/24 04:05  CO2 22 - 32 mmol/L 19 (L) 21 (L)  Glucose 70 - 99 mg/dL 677 (H) 798 (H)  Anion gap 5 - 15  16 (H) 13   Review of Glycemic Control  Diabetes history: DM2 Outpatient Diabetes medications: Metformin XR 2000 mg daily Current orders for Inpatient glycemic control: None  Inpatient Diabetes Program Recommendations:    Insulin : Lab glucose 201 mg/dl today and CBG 803 mg/ at 8:08 am today.  Patient received Lantus  10 units x1 on 12/17/24.  Please consider ordering Novolog  0-9 units Q4H since patient is NPO.  HbgA1C: Please consider ordering an A1C to evaluate glycemic control over the past 2-3 months.  Thanks, Earnie Gainer, RN, MSN, CDCES Diabetes Coordinator Inpatient Diabetes Program [phone] (Team Pager from 8am to 5pm)

## 2024-12-18 NOTE — Progress Notes (Signed)
" °  PROGRESS NOTE    Luke Wise  FMW:[id number] DOB: 1991/01/09 DOA: 12/17/2024 PCP: Laurent Drivers, MD  [id number]  LOS: 1 day   Brief hospital course:   Assessment & Plan: Luke Wise is a pleasant 34 y.o. male with medical history significant for arthritis, DM, HLD, hypogonadism, history of peptic ulcer on  Protonix  who presented to ED with epigastric abdominal pain for 1 week duration.  He stated that his pain was 7/10 in intensity around epigastric and right upper quadrant region, intermittent associated with some nausea no vomiting and radiating to back.    1.  Acute pancreatitis in the setting of hypertriglyceridemia - Patient does not drink alcohol and does not have gallbladder stones. --triglyceride 1980 on presentation.   --cont MIVF --advance to low-fat diet  3.  Hypertriglyceridemia --triglyceride 1980 on presentation.  Trended down the next day. --trend level --resume home fenofibrate   --f/u with outpatient endocrin   2.  History of GERD/peptic ulcer disease --resume home PPI   4.  Type 2 diabetes With hyperglycemia --obtain A1c --cont glargine 10u daily --ACHS and SSI   6.  Leukocytosis --pt had feer to 100.9 today --trend WBC.  Consider starting abx if WBC doesn't improve.   7.  Hyponatremia  --129 on presentation --improved with IVF --monitor   DVT prophylaxis: Lovenox  SQ Code Status: Full code  Family Communication:  Level of care: Telemetry Dispo:   The patient is from: home Anticipated d/c is to: home Anticipated d/c date is: 1-2 days   Subjective and Interval History:  Pt reported abdominal pain improved.  Tolerated clear liquid diet.   Objective: Vitals:   12/18/24 1224 12/18/24 1440 12/18/24 1645 12/18/24 2124  BP: 125/79 (!) 127/90 (!) 134/92 125/88  Pulse: (!) 109 (!) 106 (!) 108 (!) 108  Resp: 16 16 17 20   Temp: (!) 100.9 F (38.3 C) 98.6 F (37 C) 98.8 F (37.1 C) 98.7 F (37.1 C)  TempSrc: Oral Oral Oral   SpO2: 99%  99% 98% 99%  Weight:      Height:        Intake/Output Summary (Last 24 hours) at 12/18/2024 2350 Last data filed at 12/18/2024 2130 Gross per 24 hour  Intake 2824.88 ml  Output --  Net 2824.88 ml   Filed Weights   12/17/24 1108  Weight: 108.4 kg    Examination:   Constitutional: NAD, AAOx3 HEENT: conjunctivae and lids normal, EOMI CV: No cyanosis.   RESP: normal respiratory effort, on RA Neuro: II - XII grossly intact.   Psych: Normal mood and affect.  Appropriate judgement and reason   Data Reviewed: I have personally reviewed labs and imaging studies  Time spent: 50 minutes  Ellouise Haber, MD [facility] If 7PM-7AM, please contact night-coverage 12/18/2024, 11:50 PM   "

## 2024-12-18 NOTE — TOC CM/SW Note (Signed)
 Transition of Care [facility]) CM/SW Note    Transition of Care [facility]) - Inpatient Brief Assessment   Patient Details  Name: Luke Wise MRN: [id number] Date of Birth: 1991-06-15  Transition of Care [facility]) CM/SW Contact:    Alfonso Rummer, LCSW Phone Number: 12/18/2024, 11:08 AM   Clinical Narrative:  Completed toc chart review. No toc needs identified please contact toc should needs arise.   Transition of Care Asessment: Insurance and Status: Insurance coverage has been reviewed Patient has primary care physician: Yes (VIA, KEVIN) Home environment has been reviewed: single family home   Prior/Current Home Services: No current home services Social Drivers of Health Review: SDOH reviewed no interventions necessary Readmission risk has been reviewed: No Transition of care needs: no transition of care needs at this time

## 2024-12-18 NOTE — Plan of Care (Signed)
" °  Problem: Education: Goal: Knowledge of General Education information will improve Description: Including pain rating scale, medication(s)/side effects and non-pharmacologic comfort measures 12/18/2024 0512 by Gretta Dagoberto HERO, RN Outcome: Progressing 12/18/2024 0512 by Gretta Dagoberto HERO, RN Outcome: Progressing   Problem: Health Behavior/Discharge Planning: Goal: Ability to manage health-related needs will improve 12/18/2024 0512 by Gretta Dagoberto HERO, RN Outcome: Progressing 12/18/2024 0512 by Gretta Dagoberto HERO, RN Outcome: Progressing   Problem: Clinical Measurements: Goal: Ability to maintain clinical measurements within normal limits will improve 12/18/2024 0512 by Gretta Dagoberto HERO, RN Outcome: Progressing 12/18/2024 0512 by Gretta Dagoberto HERO, RN Outcome: Progressing Goal: Will remain free from infection 12/18/2024 0512 by Gretta Dagoberto HERO, RN Outcome: Progressing 12/18/2024 0512 by Gretta Dagoberto HERO, RN Outcome: Progressing Goal: Diagnostic test results will improve 12/18/2024 0512 by Gretta Dagoberto HERO, RN Outcome: Progressing 12/18/2024 0512 by Gretta Dagoberto HERO, RN Outcome: Progressing Goal: Respiratory complications will improve 12/18/2024 0512 by Gretta Dagoberto HERO, RN Outcome: Progressing 12/18/2024 0512 by Gretta Dagoberto HERO, RN Outcome: Progressing Goal: Cardiovascular complication will be avoided 12/18/2024 0512 by Gretta Dagoberto HERO, RN Outcome: Progressing 12/18/2024 0512 by Gretta Dagoberto HERO, RN Outcome: Progressing   Problem: Activity: Goal: Risk for activity intolerance will decrease 12/18/2024 0512 by Gretta Dagoberto HERO, RN Outcome: Progressing 12/18/2024 0512 by Gretta Dagoberto HERO, RN Outcome: Progressing   Problem: Nutrition: Goal: Adequate nutrition will be maintained 12/18/2024 0512 by Gretta Dagoberto HERO, RN Outcome: Progressing 12/18/2024 0512 by Gretta Dagoberto HERO, RN Outcome: Progressing   Problem: Coping: Goal: Level of anxiety will decrease 12/18/2024 0512 by Gretta Dagoberto HERO, RN Outcome: Progressing 12/18/2024 0512 by Gretta Dagoberto HERO, RN Outcome: Progressing   Problem: Elimination: Goal: Will not experience complications related to bowel motility 12/18/2024 0512 by Gretta Dagoberto HERO, RN Outcome: Progressing 12/18/2024 0512 by Gretta Dagoberto HERO, RN Outcome: Progressing Goal: Will not experience complications related to urinary retention 12/18/2024 0512 by Gretta Dagoberto HERO, RN Outcome: Progressing 12/18/2024 0512 by Gretta Dagoberto HERO, RN Outcome: Progressing   Problem: Pain Managment: Goal: General experience of comfort will improve and/or be controlled 12/18/2024 0512 by Gretta Dagoberto HERO, RN Outcome: Progressing 12/18/2024 0512 by Gretta Dagoberto HERO, RN Outcome: Progressing   Problem: Safety: Goal: Ability to remain free from injury will improve 12/18/2024 0512 by Gretta Dagoberto HERO, RN Outcome: Progressing 12/18/2024 0512 by Gretta Dagoberto HERO, RN Outcome: Progressing   Problem: Skin Integrity: Goal: Risk for impaired skin integrity will decrease 12/18/2024 0512 by Gretta Dagoberto HERO, RN Outcome: Progressing 12/18/2024 0512 by Gretta Dagoberto HERO, RN Outcome: Progressing   "

## 2024-12-19 ENCOUNTER — Other Ambulatory Visit (HOSPITAL_COMMUNITY): Payer: Self-pay

## 2024-12-19 ENCOUNTER — Telehealth (HOSPITAL_COMMUNITY): Payer: Self-pay

## 2024-12-19 ENCOUNTER — Other Ambulatory Visit: Payer: Self-pay

## 2024-12-19 LAB — CBC
HCT: 39.8 % (ref 39.0–52.0)
Hemoglobin: 12.5 g/dL — ABNORMAL LOW (ref 13.0–17.0)
MCH: 23.5 pg — ABNORMAL LOW (ref 26.0–34.0)
MCHC: 31.4 g/dL (ref 30.0–36.0)
MCV: 75 fL — ABNORMAL LOW (ref 80.0–100.0)
Platelets: 424 K/uL — ABNORMAL HIGH (ref 150–400)
RBC: 5.31 MIL/uL (ref 4.22–5.81)
RDW: 13.1 % (ref 11.5–15.5)
WBC: 12.2 K/uL — ABNORMAL HIGH (ref 4.0–10.5)
nRBC: 0 % (ref 0.0–0.2)

## 2024-12-19 LAB — HEMOGLOBIN A1C
Hgb A1c MFr Bld: 10.2 % — ABNORMAL HIGH (ref 4.8–5.6)
Mean Plasma Glucose: 246.04 mg/dL

## 2024-12-19 LAB — MAGNESIUM: Magnesium: 2 mg/dL (ref 1.7–2.4)

## 2024-12-19 LAB — GLUCOSE, CAPILLARY
Glucose-Capillary: 194 mg/dL — ABNORMAL HIGH (ref 70–99)
Glucose-Capillary: 295 mg/dL — ABNORMAL HIGH (ref 70–99)

## 2024-12-19 LAB — TRIGLYCERIDES: Triglycerides: 731 mg/dL — ABNORMAL HIGH

## 2024-12-19 MED ORDER — INSULIN ASPART 100 UNIT/ML IJ SOLN
0.0000 [IU] | Freq: Three times a day (TID) | INTRAMUSCULAR | Status: DC
  Administered 2024-12-19: 8 [IU] via SUBCUTANEOUS
  Administered 2024-12-19: 3 [IU] via SUBCUTANEOUS
  Filled 2024-12-19: qty 8
  Filled 2024-12-19: qty 3

## 2024-12-19 MED ORDER — ROSUVASTATIN CALCIUM 10 MG PO TABS
10.0000 mg | ORAL_TABLET | Freq: Every day | ORAL | 11 refills | Status: AC
  Filled 2024-12-19: qty 30, 30d supply, fill #0

## 2024-12-19 MED ORDER — INSULIN GLARGINE 100 UNIT/ML ~~LOC~~ SOLN
10.0000 [IU] | Freq: Every day | SUBCUTANEOUS | Status: DC
  Administered 2024-12-19: 10 [IU] via SUBCUTANEOUS
  Filled 2024-12-19: qty 0.1

## 2024-12-19 NOTE — Progress Notes (Signed)
 Mobility Specialist - Progress Note   12/19/24 1000  Mobility  Activity Ambulated with assistance;Ambulated independently;Stood with assistance  Level of Assistance Independent  Assistive Device None  Distance Ambulated (ft) 25 ft  Range of Motion/Exercises Active  Activity Response Tolerated well  Mobility visit 1 Mobility  Mobility Specialist Start Time (ACUTE ONLY) [id number]  Mobility Specialist Stop Time (ACUTE ONLY) [id number]  Mobility Specialist Time Calculation (min) (ACUTE ONLY) 6 min   Pt was in bathroom upon entry. Pt agreed to mobility. Pt was able to ambulate throughout room. After activity pt proceeded to do oral care. Pt has needs in reach.  Luke Wise Mobility Specialist 12/19/24, 10:08 AM

## 2024-12-19 NOTE — Telephone Encounter (Signed)
 Pharmacy Patient Advocate Encounter  Insurance verification completed.    The patient is insured through [facility]. Patient has [employer], may use a copay card, and/or apply for patient assistance if available.    Ran test claim for Lantus  100unit pen and the current 30 day co-pay is $18.62.  Ran test claim for Novolog  100unit pen and the current 30 day co-pay is $26.95.  Ran test claim for Humalog 100unit kwikpen and the current 30 day co-pay is $30.68  This test claim was processed through [employer]- copay amounts may vary at other pharmacies due to [employer], or as the patient moves through the different stages of their insurance plan.

## 2024-12-19 NOTE — Inpatient Diabetes Management (Addendum)
 Inpatient Diabetes Program Recommendations  AACE/ADA: New Consensus Statement on Inpatient Glycemic Control   Target Ranges:  Prepandial:   less than 140 mg/dL      Peak postprandial:   less than 180 mg/dL (1-2 hours)      Critically ill patients:  140 - 180 mg/dL    Latest Reference Range & Units 12/17/24 21:01 12/18/24 08:08 12/18/24 11:13 12/19/24 07:58  Glucose-Capillary 70 - 99 mg/dL 776 (H) 803 (H) 782 (H) 194 (H)    Latest Reference Range & Units 12/18/24 04:05  Hemoglobin A1C 4.8 - 5.6 % 10.2 (H)    Latest Reference Range & Units 12/17/24 11:10 12/18/24 04:05  Glucose 70 - 99 mg/dL 677 (H) 798 (H)   Review of Glycemic Control  Diabetes history: DM2 Outpatient Diabetes medications: Metformin XR 2000 mg daily Current orders for Inpatient glycemic control: Lantus  10 units daily, Novolog  0-15 units TID with meals   Inpatient Diabetes Program Recommendations:     HbgA1C: A1C 10.2% on 12/18/24 indicating an average glucose of 246 mg/dl over the past 2-3 months. Patient will need additional DM medications prescribed at discharge.  Addendum 12/19/24@11 :45-Spoke with patient at bedside about diabetes and home regimen for diabetes control. Patient reports being followed by PCP for diabetes management and currently taking Metformin 2000 mg daily as an outpatient for diabetes control. Patient reports taking DM medications consistently as prescribed. Patient has testing supplies at home but does not check glucose very often.  Inquired about prior A1C and patient reports last A1C in October was 9%.  Discussed A1C results (10.2% on 12/18/24) and explained that current A1C indicates an average glucose of 246 mg/dl over the past 2-3 months. Discussed glucose and A1C goals. Patient states he has noticed being more thirsty and having a dry mouth over the past couple weeks but denies any other symptoms of hyperglycemia.  Discussed importance of checking CBGs and maintaining good CBG control to prevent  long-term and short-term complications. Explained how hyperglycemia leads to damage within blood vessels which lead to the common complications seen with uncontrolled diabetes. Stressed to the patient the importance of improving glycemic control to prevent further complications from uncontrolled diabetes. Discussed impact of nutrition, exercise, stress, sickness, and medications on diabetes control. Patient has been sick a couple weeks ago, he thinks he had COVID but did not get tested to confirm. Discussed how pancreatitis can impact glycemic control.  Discussed carbohydrates, carbohydrate goals per day and meal, along with portion sizes. Patient drinks diet and zero drinks already and he has been trying to watch his carbohydrates.  Encouraged patient to check glucose at least 2 times per day.  Patient has a follow up appointment with PCP in March 2026. Asked that if CBGs are staying over 180-200 mg/dl to call PCP to be seen in the next week or so if possible. Patient will plan to start doing some type of activity at least 30 mins a day. Explained that given A1C of 10.2%, he will likely need to additional DM medication prescribed.   Patient verbalized understanding of information discussed and reports no further questions at this time related to diabetes. Patient has discharge orders already and no additional DM medications prescribed. Sent Dr. Caleen and Darice, RN chat message to ask if patient would be prescribed additional DM medication or not given A1C of 10.2%.  Thanks, Earnie Gainer, RN, MSN, CDCES Diabetes Coordinator Inpatient Diabetes Program [phone] (Team Pager from 8am to 5pm)

## 2024-12-19 NOTE — Plan of Care (Signed)
 IV removed, discharge instructions reviewed, Rx delivered to room and patient discharged to home

## 2024-12-19 NOTE — Discharge Summary (Signed)
 " Physician Discharge Summary   Patient: Luke Wise MRN: [id number] DOB: May 18, 1991  Admit date:     12/17/2024  Discharge date: 12/19/24  Discharge Physician: Amaryllis Dare   PCP: Laurent Drivers, MD   Recommendations at discharge:  Please obtain CBC, CMP and lipid panel Please make sure that triglycerides are trending down Follow-up with primary care provider within a week  Discharge Diagnoses: Principal Problem:   Acute pancreatitis Active Problems:   Hypogonadism male   Hypertriglyceridemia   GERD (gastroesophageal reflux disease)   DM (diabetes mellitus) [facility])  Hospital Course: Luke Wise is a pleasant 34 y.o. male with medical history significant for arthritis, DM, HLD, hypogonadism, history of peptic ulcer on  Protonix  who presented to ED with epigastric abdominal pain for 1 week duration with associated nausea, no vomiting and radiating to back.  Patient was found to have acute pancreatitis likely secondary to hypertriglyceridemia, triglycerides were 1980 on presentation.  Patient does not drink alcohol.  He was managed conservatively with slowly improving triglyceride.  Patient should continue taking home fenofibrate , low-dose Crestor  was also added and he will need a close follow-up with PCP for further management.  Patient also has uncontrolled diabetes with hyperglycemia and A1c of 10.2, he was just taking metformin at home.  Need to have a close follow-up with PCP for better control of diabetes.  Patient will continue with his home medications and need to have a close follow-up with his providers for further assistance.  Consultants: None Procedures performed: None Disposition: Home Diet recommendation:  Carb modified diet DISCHARGE MEDICATION: Allergies as of 12/19/2024       Reactions   Amoxicillin Hives   RASH    Clavulanic Acid Hives   Penicillins Hives   RASH        Medication List     STOP taking these medications    clomiPHENE  50 MG  tablet Commonly known as: CLOMID    cyclobenzaprine  10 MG tablet Commonly known as: FLEXERIL        TAKE these medications    diclofenac  Sodium 1 % Gel Commonly known as: VOLTAREN  Apply 2 g topically 3 (three) times daily as needed.   ergocalciferol 1.25 MG (50000 UT) capsule Commonly known as: VITAMIN D2 Take 50,000 Units by mouth once a week.   fenofibrate  48 MG tablet Commonly known as: TRICOR  200 mg daily.   metFORMIN 500 MG 24 hr tablet Commonly known as: GLUCOPHAGE-XR Take 2,000 mg by mouth daily.   pantoprazole  40 MG tablet Commonly known as: PROTONIX  Take 40 mg by mouth daily.   rosuvastatin  10 MG tablet Commonly known as: Crestor  Take 1 tablet (10 mg total) by mouth daily.        Follow-up Information     Via, Drivers, MD. Schedule an appointment as soon as possible for a visit in 1 week(s).   Specialty: Family Medicine Contact information: [address] [place] [address] [phone]                Discharge Exam: Luke Wise   12/17/24 1108  Weight: 108.4 kg   General.  Obese gentleman, in no acute distress. Pulmonary.  Lungs clear bilaterally, normal respiratory effort. CV.  Regular rate and rhythm, no JVD, rub or murmur. Abdomen.  Soft, nontender, nondistended, BS positive. CNS.  Alert and oriented .  No focal neurologic deficit. Extremities.  No edema,  pulses intact and symmetrical. Psychiatry.  Judgment and insight appears normal.   Condition at discharge: stable  The results  of significant diagnostics from this hospitalization (including imaging, microbiology, ancillary and laboratory) are listed below for reference.   Imaging Studies: DG Chest 2 View Result Date: 12/17/2024 CLINICAL DATA:  Abdominal pain EXAM: CHEST - 2 VIEW COMPARISON:  02/24/2017 FINDINGS: Hypoventilatory change. Normal cardiac size. No consolidation, pleural effusion or pneumothorax IMPRESSION: No active cardiopulmonary disease. Electronically Signed    By: Luke Bun M.D.   On: 12/17/2024 19:27   US  Abdomen Limited RUQ (LIVER/GB) Result Date: 12/17/2024 CLINICAL DATA:  Right upper quadrant pain. EXAM: ULTRASOUND ABDOMEN LIMITED RIGHT UPPER QUADRANT COMPARISON:  None Available. FINDINGS: Gallbladder: No gallstones or wall thickening visualized. No sonographic Murphy sign noted by sonographer. Common bile duct: Diameter: 4 mm, within normal limits. No intrahepatic biliary ductal dilatation. Liver: Diffusely increased in echogenicity with probable sparing along the gallbladder fossa. No focal lesion. Portal vein is patent on color Doppler imaging with normal direction of blood flow towards the liver. Other: None. IMPRESSION: 1. No acute findings. 2. Hepatic steatosis. Electronically Signed   By: Newell Eke M.D.   On: 12/17/2024 14:25   CT ABDOMEN PELVIS W CONTRAST Result Date: 12/17/2024 CLINICAL DATA:  Abdominal pain x1 week. EXAM: CT ABDOMEN AND PELVIS WITH CONTRAST TECHNIQUE: Multidetector CT imaging of the abdomen and pelvis was performed using the standard protocol following bolus administration of intravenous contrast. RADIATION DOSE REDUCTION: This exam was performed according to the departmental dose-optimization program which includes automated exposure control, adjustment of the mA and/or kV according to patient size and/or use of iterative reconstruction technique. CONTRAST:  OMNIPAQUE  IOHEXOL  300 MG/ML  SOLN COMPARISON:  May 05, 2023 FINDINGS: Lower chest: No acute abnormality. Hepatobiliary: There is diffuse fatty infiltration of the liver parenchyma. No focal liver abnormality is seen. No gallstones, gallbladder wall thickening, or biliary dilatation. Pancreas: There is diffuse pancreatic head enlargement with a moderate amount of associated peripancreatic inflammatory fat stranding. This extends to involve the adjacent portion of the proximal duodenum. No pancreatic ductal dilatation is identified. Spleen: Normal in size without  focal abnormality. Adrenals/Urinary Tract: Adrenal glands are unremarkable. Kidneys are normal, without renal calculi, focal lesion, or hydronephrosis. Bladder is unremarkable. Stomach/Bowel: Stomach is within normal limits. The appendix is surgically absent. No evidence of bowel wall thickening, distention, or inflammatory changes. Vascular/Lymphatic: No significant vascular findings are present. No enlarged abdominal or pelvic lymph nodes. Reproductive: Prostate is unremarkable. Other: No abdominal wall hernia or abnormality. No abdominopelvic ascites. Musculoskeletal: No acute or significant osseous findings. IMPRESSION: 1. Findings consistent with moderate severity acute pancreatitis. 2. Hepatic steatosis. 3. Evidence of prior appendectomy. Electronically Signed   By: Suzen Dials M.D.   On: 12/17/2024 13:12    Microbiology: Results for orders placed or performed during the hospital encounter of 12/17/24  CULTURE, BLOOD (ROUTINE X 2) w Reflex to ID Panel     Status: None (Preliminary result)   Collection Time: 12/17/24  5:00 PM   Specimen: BLOOD  Result Value Ref Range Status   Specimen Description BLOOD BLOOD LEFT HAND  Final   Special Requests   Final    BOTTLES DRAWN AEROBIC AND ANAEROBIC Blood Culture adequate volume   Culture   Final    NO GROWTH 2 DAYS Performed at [facility], [address] [place], [place] [address]    Report Status PENDING  Incomplete  CULTURE, BLOOD (ROUTINE X 2) w Reflex to ID Panel     Status: None (Preliminary result)   Collection Time: 12/17/24  5:01 PM  Specimen: BLOOD  Result Value Ref Range Status   Specimen Description   Final    BLOOD BLOOD LEFT ARM Performed at [facility], [address] [place], [place] [address]    Special Requests   Final    BOTTLES DRAWN AEROBIC AND ANAEROBIC Blood Culture adequate volume Performed at [facility], [address] [place], [place] [address]    Culture  Setup Time  PENDING  Incomplete   Culture   Final    NO GROWTH 2 DAYS Performed at [facility], [address] [place], [place] [address]    Report Status PENDING  Incomplete    Labs: CBC: Recent Labs  Lab 12/17/24 1110 12/18/24 0405 12/19/24 0539  WBC 23.0* 17.9* 12.2*  HGB 14.3 12.1* 12.5*  HCT 44.4 38.0* 39.8  MCV 73.1* 74.5* 75.0*  PLT 435* 378 424*   Basic Metabolic Panel: Recent Labs  Lab 12/17/24 1110 12/18/24 0405 12/19/24 0539  NA 129* 134*  --   K 4.0 3.7  --   CL 95* 101  --   CO2 19* 21*  --   GLUCOSE 322* 201*  --   BUN 15 13  --   CREATININE 0.81 0.70  --   CALCIUM  9.3 8.6*  --   MG  --   --  2.0   Liver Function Tests: Recent Labs  Lab 12/17/24 1110 12/18/24 0405  AST 13* 11*  ALT 26 19  ALKPHOS 58 52  BILITOT 0.8 0.4  PROT 8.3* 6.9  ALBUMIN 3.9 3.4*   CBG: Recent Labs  Lab 12/17/24 2101 12/18/24 0808 12/18/24 1113 12/19/24 0758  GLUCAP 223* 196* 217* 194*    Discharge time spent: greater than 30 minutes.  This record has been created using Conservation officer, historic buildings. Errors have been sought and corrected,but may not always be located. Such creation errors do not reflect on the standard of care.   Signed: Amaryllis Dare, MD [facility] 12/19/2024 "

## 2024-12-22 LAB — CULTURE, BLOOD (ROUTINE X 2)
Culture: NO GROWTH
Special Requests: ADEQUATE

## 2024-12-23 LAB — CULTURE, BLOOD (ROUTINE X 2)
Culture  Setup Time: NO GROWTH
Report Status: NO GROWTH
Special Requests: ADEQUATE
